# Patient Record
Sex: Female | Born: 1985 | Race: Black or African American | Hispanic: No | Marital: Single | Smoking: Current every day smoker
Health system: Southern US, Community
[De-identification: ages and names within clinical notes are randomized; demographics above are authoritative.]

## PROBLEM LIST (undated history)

## (undated) DIAGNOSIS — R51 Headache: Secondary | ICD-10-CM

## (undated) DIAGNOSIS — R519 Headache, unspecified: Secondary | ICD-10-CM

## (undated) DIAGNOSIS — F419 Anxiety disorder, unspecified: Secondary | ICD-10-CM

## (undated) HISTORY — PX: EXTERNAL EAR SURGERY: SHX627

## (undated) HISTORY — PX: FRACTURE SURGERY: SHX138

## (undated) HISTORY — DX: Headache: R51

## (undated) HISTORY — DX: Anxiety disorder, unspecified: F41.9

## (undated) HISTORY — PX: OTHER SURGICAL HISTORY: SHX169

## (undated) HISTORY — DX: Headache, unspecified: R51.9

---
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
## 2013-07-28 ENCOUNTER — Emergency Department (HOSPITAL_COMMUNITY): Payer: BC Managed Care – PPO

## 2013-07-28 ENCOUNTER — Encounter (HOSPITAL_COMMUNITY): Payer: Self-pay | Admitting: Emergency Medicine

## 2013-07-28 ENCOUNTER — Emergency Department (HOSPITAL_COMMUNITY)
Admission: EM | Admit: 2013-07-28 | Discharge: 2013-07-29 | Disposition: A | Discharge status: Home / Self Care | Payer: BC Managed Care – PPO | Attending: Emergency Medicine | Admitting: Emergency Medicine

## 2013-07-28 DIAGNOSIS — Y9367 Activity, basketball: Secondary | ICD-10-CM | POA: Insufficient documentation

## 2013-07-28 DIAGNOSIS — S93409A Sprain of unspecified ligament of unspecified ankle, initial encounter: Secondary | ICD-10-CM | POA: Insufficient documentation

## 2013-07-28 DIAGNOSIS — S93402A Sprain of unspecified ligament of left ankle, initial encounter: Secondary | ICD-10-CM

## 2013-07-28 DIAGNOSIS — Y9239 Other specified sports and athletic area as the place of occurrence of the external cause: Secondary | ICD-10-CM | POA: Insufficient documentation

## 2013-07-28 DIAGNOSIS — F17200 Nicotine dependence, unspecified, uncomplicated: Secondary | ICD-10-CM | POA: Insufficient documentation

## 2013-07-28 DIAGNOSIS — X500XXA Overexertion from strenuous movement or load, initial encounter: Secondary | ICD-10-CM | POA: Insufficient documentation

## 2013-07-28 NOTE — ED Provider Notes (Signed)
CSN: [id number]     Arrival date & time 07/28/13  2118 History  This chart was scribed for non-physician practitioner Marlon Pel, PA-C, working with Layla Maw Ward, DO by Dorothey Baseman, ED Scribe. This patient was seen in room TR06C/TR06C and the patient's care was started at 11:42 PM.    Chief Complaint  Patient presents with  . Ankle Pain   The history is provided by the patient. No language interpreter was used.   HPI Comments: Teresa Rose is a 27 y.o. female who presents to the Emergency Department complaining of an injury to the left ankle onset around 6 hours ago when she states that she was playing basketball and rolled her ankle. Patient reports an associated constant pain. She denies hitting her head or syncope. Patient states that she has been ambulatory since onset, but that the pain is exacerbated with walking and bearing weight. She reports associated swelling to the area. She reports taking Tylenol at home with mild, temporary relief. Patient states that she fractured her left toe 6 weeks ago. She denies any other pertinent medical history.   History reviewed. No pertinent past medical history. History reviewed. No pertinent past surgical history. No family history on file. History  Substance Use Topics  . Smoking status: Current Every Day Smoker  . Smokeless tobacco: Not on file  . Alcohol Use: No   OB History   Grav Para Term Preterm Abortions TAB SAB Ect Mult Living                 Review of Systems  Musculoskeletal: Positive for arthralgias and joint swelling.  Neurological: Negative for syncope.  All other systems reviewed and are negative.    Allergies  Review of patient's allergies indicates no known allergies.  Home Medications   Current Outpatient Rx  Name  Route  Sig  Dispense  Refill  . acetaminophen (TYLENOL) 500 MG tablet   Oral   Take 1,000 mg by mouth every 6 (six) hours as needed for pain.         Marland Kitchen HYDROcodone-acetaminophen  (NORCO/VICODIN) 5-325 MG per tablet   Oral   Take 1 tablet by mouth every 4 (four) hours as needed for pain.   10 tablet   0   . ibuprofen (ADVIL,MOTRIN) 600 MG tablet   Oral   Take 1 tablet (600 mg total) by mouth every 6 (six) hours as needed for pain.   30 tablet   0    Triage Vitals: BP 157/83  Pulse 93  Temp(Src) 98.3 F (36.8 C) (Oral)  Resp 16  SpO2 100%  LMP 07/18/2013  Physical Exam  Nursing note and vitals reviewed. Constitutional: She is oriented to person, place, and time. She appears well-developed and well-nourished. No distress.  HENT:  Head: Normocephalic and atraumatic.  Eyes: Conjunctivae are normal.  Neck: Normal range of motion. Neck supple.  Musculoskeletal: Normal range of motion.  Significant swelling and tenderness to palpation to the left lateral malleolus.   Neurological: She is alert and oriented to person, place, and time.  Skin: Skin is warm and dry.  Psychiatric: She has a normal mood and affect. Her behavior is normal.    ED Course  Procedures (including critical care time)  DIAGNOSTIC STUDIES: Oxygen Saturation is 100% on room air, normal by my interpretation.    COORDINATION OF CARE: 11:43 PM- Discussed that x-ray does not indicate any fractures. Will discharge patient with an ACE wrap, crutches, Motrin, and Vicodin. Advised patient  to apply ice to the area and to keep it elevated.Advised patient to follow up with the referred orthopaedist, especially if symptoms do not improve in about 1 week or if there are any new or worsening symptoms. Discussed treatment plan with patient at bedside and patient verbalized agreement.      Labs Review Labs Reviewed - No data to display  Imaging Review Dg Ankle Complete Left  07/28/2013   CLINICAL DATA:  Pain post trauma  EXAM: LEFT ANKLE COMPLETE - 3+ VIEW  COMPARISON:  None.  FINDINGS: Frontal, oblique, and lateral views were obtained. There is soft tissue swelling laterally. No fracture or  effusion. Ankle mortise appears intact.  IMPRESSION: Swelling laterally. No fracture. Mortise intact.   Electronically Signed   By: Bretta Bang M.D.   On: 07/28/2013 21:45    EKG Interpretation   None       MDM   1. Ankle sprain, left, initial encounter     27 y.o.Teresa Rose's evaluation in the Emergency Department is complete. It has been determined that no acute conditions requiring further emergency intervention are present at this time. The patient/guardian have been advised of the diagnosis and plan. We have discussed signs and symptoms that warrant return to the ED, such as changes or worsening in symptoms.  Vital signs are stable at discharge. Filed Vitals:   07/28/13 2123  BP: 157/83  Pulse: 93  Temp: 98.3 F (36.8 C)  Resp: 16    Patient/guardian has voiced understanding and agreed to follow-up with the PCP or specialist.  I personally performed the services described in this documentation, which was scribed in my presence. The recorded information has been reviewed and is accurate.     Dorthula Matas, PA-C 07/29/13 [phone]

## 2013-07-28 NOTE — ED Notes (Signed)
Pt. reports injury / pain / swelling at left ankle this afternoon while playing basketball .

## 2013-07-29 MED ORDER — IBUPROFEN 600 MG PO TABS: 600.0000 mg | ORAL_TABLET | Freq: Four times a day (QID) | ORAL | Status: DC | PRN

## 2013-07-29 MED ORDER — HYDROCODONE-ACETAMINOPHEN 5-325 MG PO TABS: 1.0000 | ORAL_TABLET | ORAL | Status: DC | PRN

## 2013-07-29 NOTE — Progress Notes (Signed)
Orthopedic Tech Progress Note Patient Details:  Teresa Rose 11-13-1985 [id number]  Ortho Devices Type of Ortho Device: Crutches   Haskell Flirt 07/29/2013, 12:20 AM

## 2013-07-29 NOTE — ED Provider Notes (Signed)
Medical screening examination/treatment/procedure(s) were performed by non-physician practitioner and as supervising physician I was immediately available for consultation/collaboration.   Kiril Hippe N Taisha Pennebaker, DO 07/29/13 0420 

## 2013-12-31 ENCOUNTER — Encounter: Payer: Self-pay | Admitting: Medical

## 2013-12-31 ENCOUNTER — Ambulatory Visit (INDEPENDENT_AMBULATORY_CARE_PROVIDER_SITE_OTHER): Payer: BC Managed Care – PPO | Admitting: Medical

## 2013-12-31 VITALS — BP 140/98 | HR 82 | Temp 98.2°F | Resp 16 | Ht 67.0 in | Wt 188.0 lb

## 2013-12-31 DIAGNOSIS — R519 Headache, unspecified: Secondary | ICD-10-CM

## 2013-12-31 DIAGNOSIS — R030 Elevated blood-pressure reading, without diagnosis of hypertension: Secondary | ICD-10-CM

## 2013-12-31 DIAGNOSIS — R51 Headache: Secondary | ICD-10-CM

## 2013-12-31 DIAGNOSIS — Z8249 Family history of ischemic heart disease and other diseases of the circulatory system: Secondary | ICD-10-CM

## 2013-12-31 NOTE — Patient Instructions (Signed)
Thank you for giving me the opportunity to serve you today.    Your diagnosis today includes: Encounter Diagnosis  Name Primary?  . Elevated blood pressure reading without diagnosis of hypertension Yes     Specific recommendations today include:  Begin lifestyle changes to help improve your blood pressure and headaches  Gradually start cutting back on caffeine and salt including Mountain Dew  Limit salt in general (canned foods, fast food, fried foods, eating out)  Begin drinking more water  Begin more of a DASH diet   Check your blood pressure 1-2 times per week, write these numbers down with date and time  Work on losing a little weight through exercise and these diet changes above  Recheck 3 months   I have included other useful information below for your review.  Hypertension As your heart beats, it forces blood through your arteries. This force is your blood pressure. If the pressure is too high, it is called hypertension (HTN) or high blood pressure. HTN is dangerous because you may have it and not know it. High blood pressure may mean that your heart has to work harder to pump blood. Your arteries may be narrow or stiff. The extra work puts you at risk for heart disease, stroke, and other problems.  Blood pressure consists of two numbers, a higher number over a lower, 110/72, for example. It is stated as "110 over 72." The ideal is below 120 for the top number (systolic) and under 80 for the bottom (diastolic). Write down your blood pressure today. You should pay close attention to your blood pressure if you have certain conditions such as:  Heart failure.  Prior heart attack.  Diabetes  Chronic kidney disease.  Prior stroke.  Multiple risk factors for heart disease. To see if you have HTN, your blood pressure should be measured while you are seated with your arm held at the level of the heart. It should be measured at least twice. A one-time elevated blood  pressure reading (especially in the Emergency Department) does not mean that you need treatment. There may be conditions in which the blood pressure is different between your right and left arms. It is important to see your caregiver soon for a recheck. Most people have essential hypertension which means that there is not a specific cause. This type of high blood pressure may be lowered by changing lifestyle factors such as:  Stress.  Smoking.  Lack of exercise.  Excessive weight.  Drug/tobacco/alcohol use.  Eating less salt. Most people do not have symptoms from high blood pressure until it has caused damage to the body. Effective treatment can often prevent, delay or reduce that damage. TREATMENT  When a cause has been identified, treatment for high blood pressure is directed at the cause. There are a large number of medications to treat HTN. These fall into several categories, and your caregiver will help you select the medicines that are [place] for you. Medications may have side effects. You should review side effects with your caregiver. If your blood pressure stays high after you have made lifestyle changes or started on medicines,   Your medication(s) may need to be changed.  Other problems may need to be addressed.  Be certain you understand your prescriptions, and know how and when to take your medicine.  Be sure to follow up with your caregiver within the time frame advised (usually within two weeks) to have your blood pressure rechecked and to review your medications.  If  you are taking more than one medicine to lower your blood pressure, make sure you know how and at what times they should be taken. Taking two medicines at the same time can result in blood pressure that is too low. SEEK IMMEDIATE MEDICAL CARE IF:  You develop a severe headache, blurred or changing vision, or confusion.  You have unusual weakness or numbness, or a faint feeling.  You have severe chest or  abdominal pain, vomiting, or breathing problems. MAKE SURE YOU:   Understand these instructions.  Will watch your condition.  Will get help right away if you are not doing well or get worse. Document Released: 09/30/2005 Document Revised: 12/23/2011 Document Reviewed: 05/20/2008 [facility] Patient Information 2014 [place], [place].   DASH Diet The DASH diet stands for "Dietary Approaches to Stop Hypertension." It is a healthy eating plan that has been shown to reduce high blood pressure (hypertension) in as little as 14 days, while also possibly providing other significant health benefits. These other health benefits include reducing the risk of breast cancer after menopause and reducing the risk of type 2 diabetes, heart disease, colon cancer, and stroke. Health benefits also include weight loss and slowing kidney failure in patients with chronic kidney disease.  DIET GUIDELINES  Limit salt (sodium). Your diet should contain less than 1500 mg of sodium daily.  Limit refined or processed carbohydrates. Your diet should include mostly whole grains. Desserts and added sugars should be used sparingly.  Include small amounts of heart-healthy fats. These types of fats include nuts, oils, and tub margarine. Limit saturated and trans fats. These fats have been shown to be harmful in the body. CHOOSING FOODS  The following food groups are based on a 2000 calorie diet. See your Registered Dietitian for individual calorie needs. Grains and Grain Products (6 to 8 servings daily)  Eat More Often: Whole-wheat bread, brown rice, whole-grain or wheat pasta, quinoa, popcorn without added fat or salt (air popped).  Eat Less Often: White bread, white pasta, white rice, cornbread. Vegetables (4 to 5 servings daily)  Eat More Often: Fresh, frozen, and canned vegetables. Vegetables may be raw, steamed, roasted, or grilled with a minimal amount of fat.  Eat Less Often/Avoid: Creamed or fried vegetables.  Vegetables in a cheese sauce. Fruit (4 to 5 servings daily)  Eat More Often: All fresh, canned (in natural juice), or frozen fruits. Dried fruits without added sugar. One hundred percent fruit juice ( cup [237 mL] daily).  Eat Less Often: Dried fruits with added sugar. Canned fruit in light or heavy syrup. [employer], Fish, and Poultry (2 servings or less daily. One serving is 3 to 4 oz [85-114 g]).  Eat More Often: Ninety percent or leaner ground beef, tenderloin, sirloin. Round cuts of beef, chicken breast, [place] breast. All fish. Grill, bake, or broil your meat. Nothing should be fried.  Eat Less Often/Avoid: Fatty cuts of meat, [place], or chicken leg, thigh, or wing. Fried cuts of meat or fish. Dairy (2 to 3 servings)  Eat More Often: Low-fat or fat-free milk, low-fat plain or light yogurt, reduced-fat or part-skim cheese.  Eat Less Often/Avoid: Milk (whole, 2%).Whole milk yogurt. Full-fat cheeses. Nuts, Seeds, and Legumes (4 to 5 servings per week)  Eat More Often: All without added salt.  Eat Less Often/Avoid: Salted nuts and seeds, canned beans with added salt. Fats and Sweets (limited)  Eat More Often: Vegetable oils, tub margarines without trans fats, sugar-free gelatin. Mayonnaise and salad dressings.  Eat Less  Often/Avoid: Coconut oils, palm oils, butter, stick margarine, cream, half and half, cookies, candy, pie. FOR MORE INFORMATION The Dash Diet Eating Plan: www.dashdiet.org Document Released: 09/19/2011 Document Revised: 12/23/2011 Document Reviewed: 09/19/2011 [facility] Patient Information 2014 [place], [place].

## 2013-12-31 NOTE — Progress Notes (Signed)
   Subjective:   Teresa Rose is a 28 y.o. female presenting on 12/31/2013 with elevated BP  Here to establish care.   No routine medical care in a while.    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  Wednesday had career fair at school, and one of the medical studies student checked her BP.  Teacher came back next day and recheck her BP.  Was elevated both days.  Was advised to see a doctor about elevated BP.  Denies chest pain, swelling in legs, vision changes, no urinary changes.   Feeling healthy in general.  She uses no diet discretion.   Does some times add salt to food.  Drinks soda every day.   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 No other aggravating or relieving factors.  No other complaint.  Review of Systems Constitutional: -fever, -chills, -sweats, -unexpected weight change,-fatigue Cardiology:  -chest pain, -palpitations, -edema Respiratory: -cough, -shortness of breath, -wheezing Gastroenterology: -abdominal pain, -nausea, -vomiting, -diarrhea, -constipation Hematology: -bleeding or bruising problems Ophthalmology: -vision changes Urology: -dysuria, -difficulty urinating, -hematuria, -urinary frequency, -urgency Neurology: -headache, -weakness, -tingling, -numbness       Objective:     Filed Vitals:   12/31/13 1042  BP: 140/98  Pulse: 82  Temp: 98.2 F (36.8 C)  Resp: 16    General appearance: alert, no distress, WD/WN, pleasant AA female Neck: supple, no lymphadenopathy, no thyromegaly, no masses, no bruits Heart: RRR, normal S1, S2, no murmurs Lungs: CTA bilaterally, no wheezes, rhonchi, or rales Abdomen: +bs, soft, non tender, non distended, no masses, no hepatomegaly, no splenomegaly, no bruits Pulses: 2+ symmetric, upper and lower extremities, normal cap refill Extremities no edema      Assessment: Encounter Diagnoses  Name Primary?  . Elevated blood pressure reading without diagnosis of hypertension  Yes  . Frequent headaches   . Family history of premature CAD      Plan: We discussed her blood pressure findings, her diet, her headaches, her family history. We discussed causes of blood pressure problems, lifestyle changes that can be made, medications, and at this time she is willing to work on modifying her diet, reducing her caffeine and salt, increasing her water and exercise, and we will plan to recheck in 3 months for a physical.  Teresa Rose was seen today for elevated bp.  Diagnoses and associated orders for this visit:  Elevated blood pressure reading without diagnosis of hypertension  Frequent headaches  Family history of premature CAD    Return in about 3 months (around 04/02/2014).

## 2014-04-01 ENCOUNTER — Ambulatory Visit: Payer: BC Managed Care – PPO | Admitting: Medical

## 2014-04-01 ENCOUNTER — Encounter: Payer: Self-pay | Admitting: Medical

## 2014-04-19 ENCOUNTER — Telehealth: Payer: Self-pay | Admitting: Medical

## 2014-04-19 NOTE — Telephone Encounter (Signed)
The no show letter that was sent to pt. Was returned undeliverable.

## 2017-02-08 ENCOUNTER — Emergency Department (HOSPITAL_COMMUNITY)
Admission: EM | Admit: 2017-02-08 | Discharge: 2017-02-08 | Disposition: A | Discharge status: Home / Self Care | Payer: No Typology Code available for payment source | Attending: Emergency Medicine | Admitting: Emergency Medicine

## 2017-02-08 ENCOUNTER — Encounter (HOSPITAL_COMMUNITY): Payer: Self-pay | Admitting: Emergency Medicine

## 2017-02-08 ENCOUNTER — Emergency Department (HOSPITAL_COMMUNITY): Payer: No Typology Code available for payment source

## 2017-02-08 DIAGNOSIS — Y92410 Unspecified street and highway as the place of occurrence of the external cause: Secondary | ICD-10-CM | POA: Insufficient documentation

## 2017-02-08 DIAGNOSIS — Y9389 Activity, other specified: Secondary | ICD-10-CM | POA: Insufficient documentation

## 2017-02-08 DIAGNOSIS — S80811A Abrasion, right lower leg, initial encounter: Secondary | ICD-10-CM | POA: Insufficient documentation

## 2017-02-08 DIAGNOSIS — S80812A Abrasion, left lower leg, initial encounter: Secondary | ICD-10-CM | POA: Insufficient documentation

## 2017-02-08 DIAGNOSIS — F17200 Nicotine dependence, unspecified, uncomplicated: Secondary | ICD-10-CM | POA: Insufficient documentation

## 2017-02-08 DIAGNOSIS — Y999 Unspecified external cause status: Secondary | ICD-10-CM | POA: Insufficient documentation

## 2017-02-08 DIAGNOSIS — S40812A Abrasion of left upper arm, initial encounter: Secondary | ICD-10-CM | POA: Insufficient documentation

## 2017-02-08 MED ORDER — IBUPROFEN 200 MG PO TABS
600.0000 mg | ORAL_TABLET | Freq: Once | ORAL | Status: AC
  Administered 2017-02-08: 600 mg via ORAL
  Filled 2017-02-08: qty 1

## 2017-02-08 MED ORDER — METHOCARBAMOL 500 MG PO TABS: 500.0000 mg | ORAL_TABLET | Freq: Two times a day (BID) | ORAL | 0 refills | Status: DC

## 2017-02-08 MED ORDER — NAPROXEN 500 MG PO TABS: 500.0000 mg | ORAL_TABLET | Freq: Two times a day (BID) | ORAL | 0 refills | Status: DC

## 2017-02-08 NOTE — Discharge Instructions (Signed)
Take your medications as prescribed. I also recommend applying ice and/or heat to affected area for 15-20 minutes 3-4 times daily for additional pain relief. Refrain from doing any heavy lifting, squatting or repetitive movements that exacerbate your symptoms. Follow-up with your primary care provider in the next week if her symptoms have not improved.  Please return to the Emergency Department if symptoms worsen or new onset of fever, numbness, tingling, groin anesthesia, loss of bowel or bladder, weakness, unable to walk.
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
## 2017-02-08 NOTE — ED Notes (Signed)
Patient transported to X-ray 
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
## 2019-10-05 ENCOUNTER — Ambulatory Visit: Payer: BC Managed Care – PPO | Admitting: Family Medicine

## 2019-10-20 ENCOUNTER — Ambulatory Visit: Payer: BC Managed Care – PPO | Admitting: Family Medicine

## 2019-10-21 ENCOUNTER — Encounter: Payer: BC Managed Care – PPO | Admitting: Family Medicine

## 2020-10-18 LAB — OB RESULTS CONSOLE HEPATITIS B SURFACE ANTIGEN: Hepatitis B Surface Ag: NEGATIVE

## 2020-10-18 LAB — OB RESULTS CONSOLE HIV ANTIBODY (ROUTINE TESTING): HIV: NONREACTIVE

## 2020-10-18 LAB — OB RESULTS CONSOLE RUBELLA ANTIBODY, IGM: Rubella: IMMUNE

## 2020-10-19 LAB — OB RESULTS CONSOLE GC/CHLAMYDIA
Chlamydia: NEGATIVE
Gonorrhea: NEGATIVE

## 2020-10-21 ENCOUNTER — Other Ambulatory Visit: Payer: Self-pay

## 2020-11-14 DIAGNOSIS — N904 Leukoplakia of vulva: Secondary | ICD-10-CM

## 2020-11-14 HISTORY — DX: Leukoplakia of vulva: N90.4

## 2020-11-24 ENCOUNTER — Other Ambulatory Visit: Payer: Self-pay

## 2020-12-07 ENCOUNTER — Other Ambulatory Visit: Payer: Self-pay

## 2020-12-08 ENCOUNTER — Other Ambulatory Visit: Payer: Self-pay

## 2020-12-19 ENCOUNTER — Other Ambulatory Visit: Payer: Self-pay

## 2020-12-19 ENCOUNTER — Ambulatory Visit: Payer: BC Managed Care – PPO | Attending: Obstetrics and Gynecology | Admitting: Genetic Counselor

## 2020-12-19 ENCOUNTER — Ambulatory Visit: Payer: Self-pay

## 2020-12-19 ENCOUNTER — Encounter: Payer: Self-pay | Admitting: Genetic Counselor

## 2020-12-19 DIAGNOSIS — D563 Thalassemia minor: Secondary | ICD-10-CM

## 2020-12-19 DIAGNOSIS — Z315 Encounter for genetic counseling: Secondary | ICD-10-CM

## 2020-12-19 NOTE — Progress Notes (Signed)
12/19/2020  Teresa Rose 02/01/1986 MRN: [id number] DOV: 12/19/2020  Teresa Rose presented to the [facility] for a genetics consultation regarding her carrier status for alpha-thalassemia. Teresa Rose presented to her appointment alone.   Indication for genetic counseling - Silent carrier for alpha-thalassemia  Prenatal history  Teresa Rose is a G46P0, 35 y.o. female. Her current pregnancy has completed [redacted]w[redacted]d(Estimated Date of Delivery: 05/21/21).  Ms. BOsmerdenied exposure to environmental toxins or chemical agents. She denied the use of alcohol, tobacco or street drugs. She reported taking prenatal vitamins and vitamin D. She denied significant viral illnesses, fevers, and bleeding during the course of her pregnancy. Her medical and surgical histories were noncontributory.  Family History  A three generation pedigree was drafted and reviewed. Both family histories were reviewed and found to be noncontributory for birth defects, intellectual disability, recurrent pregnancy loss, and known genetic conditions. Teresa Rose limited information about her Rose's paternal family history; thus, risk assessment was limited.   The patient's ancestry is African American. She also has distant Caucasian ancestry. The father of the pregnancy's ancestry is African American. Consanguinity was denied. Teresa Rose uncertain if she has any Ashkenazi Jewish ancestry. Pedigree will be scanned under Media.  Discussion  Alpha-thalassemia:  Teresa Rose Horizon-14 carrier screening performed through [facility] The results of the screen identified her as a silent carrier for alpha-thalassemia (aa/a-). Alpha-thalassemia is different in its inheritance compared to other hemoglobinopathies as there are two copies of two alpha globin genes (HBA1 and HBA2) on each chromosome 16, or four alpha globin genes total (aa/aa). A person can be a carrier of one alpha gene mutation (aa/a-), also referred to as a  "silent carrier". A person who carries two alpha globin gene mutations can either carry them in cis (both on the same chromosome, denoted as aa/--) or in trans (on different chromosomes, denoted as a-/a-). Alpha-thalassemia carriers of two mutations who have African American ancestry are more likely to have a trans arrangement (a-/a-); cis configuration is reported to be rare in individuals with African American ancestry.     There are several different forms of alpha-thalassemia. The most severe form of alpha-thalassemia, Hb Barts, is associated with an absence of alpha globin chain synthesis as a result of deletions of all four alpha globin genes (--/--).  Given that Teresa Rose is a silent carrier (aa/a-), her pregnancies would not be at increased risk for Hb Barts, even if her Rose is a carrier for alpha-thalassemia, as she will always pass on at least one copy of the alpha globin gene to her children. Hemoglobin H (HbH) disease is caused by three deleted or dysfunctioning alpha globin alleles (a-/--) and is characterized by microcytic hypochromic hemolytic anemia, hepatosplenomegaly, mild jaundice, growth retardation, and sometimes thalassemia-like bone changes. Given Teresa Rose's silent carrier status (aa/a-), the current fetus would only be at risk for HbH disease (a-/--), if her Rose is a carrier for two alpha globin mutations in cis (aa/--). If this is the case, the risk for HbH disease in the pregnancy would be 1 in 4 (25%). However, if Teresa Rose's Rose is a carrier for two alpha globin mutations, he would be more likely to carry them in trans configuration (a-/a-) than the cis configuration (aa/--), given his ethnicity. If he is a carrier of alpha-thalassemia in trans, then the pregnancy would not be at increased risk for HbH disease. Based on the carrier frequency for alpha-thalassemia in the African  American population, Teresa Rose's Rose has a 1 in 30 chance of being any type of carrier for  alpha-thalassemia. Thus, the couple has a <1% chance of having a child with HbH disease.  Teresa Rose carrier screening was negative for the other 13 conditions screened. Thus, her risk to be a carrier for these additional conditions (listed separately in the laboratory report) has been reduced but not eliminated. This also significantly reduces her risk of having a child affected by one of these conditions. We discussed that carrier testing for alpha-thalassemia is recommended for Teresa Rose. Teresa Rose indicated that her Rose may be interested in pursuing carrier screening.  Aneuploidy screening results:  We also reviewed that Teresa Rose had Panorama NIPS through the laboratory [name] that was low-risk for fetal aneuploidies. We reviewed that these results showed a less than 1 in 10,000 risk for trisomies 21, 18 and 13, and monosomy X (Turner syndrome).  In addition, the risk for triploidy and sex chromosome trisomies (47,XXX and 47,XXY) was also low. Teresa Rose elected to have cfDNA analysis for 22q11.2 deletion syndrome, which was also low risk (1 in 3100). We reviewed that while this testing identifies 94-99% of pregnancies with trisomy 28, trisomy 42, trisomy 29, sex chromosome aneuploidies, and triploidy, it is NOT diagnostic. A positive test result requires confirmation by [facility] or amniocentesis, and a negative test result does not rule out a fetal chromosome abnormality. She also understands that this testing does not identify all genetic conditions.  Diagnostic testing:  Teresa Rose was also counseled regarding diagnostic testing via amniocentesis. We discussed the technical aspects of the procedure and quoted up to a 1 in 500 (0.2%) risk for spontaneous pregnancy loss or other adverse pregnancy outcomes as a result of amniocentesis. Cultured cells from an amniocentesis sample allow for the visualization of a fetal karyotype, which can detect >99% of large chromosomal aberrations. Chromosomal  microarray can also be performed to identify smaller deletions or duplications of fetal chromosomal material. Amniocentesis could also be performed to assess whether the baby is affected by alpha-thalassemia. After careful consideration, Ms. Bicknell declined amniocentesis at this time. She understands that amniocentesis is available at any point after 16 weeks of pregnancy and that she may opt to undergo the procedure at a later date should she change her mind.  Plan:  Ms. Caffey was potentially interested in alpha-thalassemia carrier screening for her Rose, Rondrigus Rosana Hoes. However, he recently got a new job so his health insurance is not yet activated. We reviewed that Mr. Rosana Hoes qualifies for free testing through [employer]'s Patient Assistance Program. I provided Ms. Dall with an application form for the Patient Assistance Program and encouraged her to notify me if her Rose is interested in pursuing screening. I will have the laboratory mail him a saliva kit from there.  I counseled Ms. Macpherson regarding the above risks and available options. The approximate face-to-face time with the genetic counselor was 40 minutes.  In summary:  Discussed carrier screening results and options for follow-up testing  Silent carrier for alpha-thalassemia  Recommend Rose carrier screening. Rose qualifies for free testing. Patient will discuss this with her Rose and notify me if interested. I will facilitate testing from there  Reviewed low-risk NIPS result  Reduction in risk for Down syndrome, trisomy 48, trisomy 35, triploidy, sex chromosome aneuploidies, and 22q11.2 deletion syndrome  Offered additional testing and screening  Declined amniocentesis  Reviewed family history concerns   Buelah Manis, MS, Counselling psychologist

## 2021-04-13 DIAGNOSIS — A6000 Herpesviral infection of urogenital system, unspecified: Secondary | ICD-10-CM | POA: Insufficient documentation

## 2021-04-13 HISTORY — DX: Herpesviral infection of urogenital system, unspecified: A60.00

## 2021-05-10 ENCOUNTER — Other Ambulatory Visit: Payer: Self-pay | Admitting: Obstetrics and Gynecology

## 2021-05-11 ENCOUNTER — Encounter (HOSPITAL_COMMUNITY): Payer: Self-pay

## 2021-05-11 NOTE — Patient Instructions (Signed)
Teresa Rose  05/11/2021   Your procedure is scheduled on:  05/16/2021  Arrive at 0930 at Entrance C on [address] at [facility]  and [address]. You are invited to use the FREE valet parking or use the Visitor's parking deck.  Pick up the phone at the desk and dial [phone].  Call this number if you have problems the morning of surgery: [phone]  Remember:   Do not eat food:(After Midnight) Desps de medianoche.  Do not drink clear liquids: (After Midnight) Desps de medianoche.  Take these medicines the morning of surgery with A SIP OF WATER:  Take valtrex and nitrofurantoin as prescribed   Do not wear jewelry, make-up or nail polish.  Do not wear lotions, powders, or perfumes. Do not wear deodorant.  Do not shave 48 hours prior to surgery.  Do not bring valuables to the hospital.  [facility] is not   responsible for any belongings or valuables brought to the hospital.  Contacts, dentures or bridgework may not be worn into surgery.  Leave suitcase in the car. After surgery it may be brought to your room.  For patients admitted to the hospital, checkout time is 11:00 AM the day of              discharge.      Please read over the following fact sheets that you were given:     Preparing for Surgery

## 2021-05-14 ENCOUNTER — Encounter (HOSPITAL_COMMUNITY)
Admission: RE | Admit: 2021-05-14 | Discharge: 2021-05-14 | Disposition: A | Discharge status: Home / Self Care | Payer: BC Managed Care – PPO | Source: Ambulatory Visit | Attending: Obstetrics and Gynecology | Admitting: Obstetrics and Gynecology

## 2021-05-14 ENCOUNTER — Other Ambulatory Visit: Payer: Self-pay

## 2021-05-14 ENCOUNTER — Other Ambulatory Visit: Payer: Self-pay | Admitting: Obstetrics and Gynecology

## 2021-05-14 DIAGNOSIS — Z01812 Encounter for preprocedural laboratory examination: Secondary | ICD-10-CM | POA: Insufficient documentation

## 2021-05-14 LAB — BASIC METABOLIC PANEL
Anion gap: 9 (ref 5–15)
BUN: 6 mg/dL (ref 6–20)
CO2: 20 mmol/L — ABNORMAL LOW (ref 22–32)
Calcium: 9.3 mg/dL (ref 8.9–10.3)
Chloride: 106 mmol/L (ref 98–111)
Creatinine, Ser: 0.66 mg/dL (ref 0.44–1.00)
GFR, Estimated: >60 mL/min (ref >60)
Glucose, Bld: 93 mg/dL (ref 70–99)
Potassium: 3.4 mmol/L — ABNORMAL LOW (ref 3.5–5.1)
Sodium: 135 mmol/L (ref 135–145)

## 2021-05-14 LAB — CBC
HCT: 34.8 % — ABNORMAL LOW (ref 36.0–46.0)
Hemoglobin: 11 g/dL — ABNORMAL LOW (ref 12.0–15.0)
MCH: 25.7 pg — ABNORMAL LOW (ref 26.0–34.0)
MCHC: 31.6 g/dL (ref 30.0–36.0)
MCV: 81.3 fL (ref 80.0–100.0)
Platelets: 311 10*3/uL (ref 150–400)
RBC: 4.28 MIL/uL (ref 3.87–5.11)
RDW: 15.6 % — ABNORMAL HIGH (ref 11.5–15.5)
WBC: 7.8 10*3/uL (ref 4.0–10.5)
nRBC: 0 % (ref 0.0–0.2)

## 2021-05-14 LAB — TYPE AND SCREEN
ABO/RH(D): B NEG
Antibody Screen: NEGATIVE

## 2021-05-15 LAB — RPR: RPR Ser Ql: NONREACTIVE

## 2021-05-15 LAB — SARS CORONAVIRUS 2 (TAT 6-24 HRS): SARS Coronavirus 2: POSITIVE — AB

## 2021-05-15 NOTE — Pre-Procedure Instructions (Signed)
Dr Su Hilt, [facility] OR Charge nurse and patient informed of positive covid result.  Plan of care discussed with patient and all questions answered.

## 2021-05-16 ENCOUNTER — Encounter (HOSPITAL_COMMUNITY)
Admission: RE | Disposition: A | Discharge status: Home / Self Care | Payer: Self-pay | Source: Home / Self Care | Attending: Obstetrics & Gynecology

## 2021-05-16 ENCOUNTER — Inpatient Hospital Stay (HOSPITAL_COMMUNITY)
Admission: RE | Admit: 2021-05-16 | Discharge: 2021-05-18 | DRG: 787 | Disposition: A | Discharge status: Home / Self Care | Payer: BC Managed Care – PPO | Attending: Obstetrics & Gynecology | Admitting: Obstetrics & Gynecology

## 2021-05-16 ENCOUNTER — Other Ambulatory Visit: Payer: Self-pay

## 2021-05-16 ENCOUNTER — Encounter (HOSPITAL_COMMUNITY): Payer: Self-pay | Admitting: Obstetrics and Gynecology

## 2021-05-16 ENCOUNTER — Inpatient Hospital Stay (HOSPITAL_COMMUNITY): Payer: BC Managed Care – PPO | Admitting: Anesthesiology

## 2021-05-16 DIAGNOSIS — O9081 Anemia of the puerperium: Secondary | ICD-10-CM | POA: Diagnosis not present

## 2021-05-16 DIAGNOSIS — Z3A39 39 weeks gestation of pregnancy: Secondary | ICD-10-CM

## 2021-05-16 DIAGNOSIS — D62 Acute posthemorrhagic anemia: Secondary | ICD-10-CM | POA: Diagnosis not present

## 2021-05-16 DIAGNOSIS — O99334 Smoking (tobacco) complicating childbirth: Secondary | ICD-10-CM | POA: Diagnosis present

## 2021-05-16 DIAGNOSIS — B009 Herpesviral infection, unspecified: Secondary | ICD-10-CM | POA: Diagnosis present

## 2021-05-16 DIAGNOSIS — O9832 Other infections with a predominantly sexual mode of transmission complicating childbirth: Principal | ICD-10-CM | POA: Diagnosis present

## 2021-05-16 DIAGNOSIS — D563 Thalassemia minor: Secondary | ICD-10-CM

## 2021-05-16 DIAGNOSIS — A6000 Herpesviral infection of urogenital system, unspecified: Secondary | ICD-10-CM | POA: Diagnosis present

## 2021-05-16 DIAGNOSIS — F17210 Nicotine dependence, cigarettes, uncomplicated: Secondary | ICD-10-CM | POA: Diagnosis present

## 2021-05-16 DIAGNOSIS — Z6791 Unspecified blood type, Rh negative: Secondary | ICD-10-CM | POA: Diagnosis present

## 2021-05-16 DIAGNOSIS — O98513 Other viral diseases complicating pregnancy, third trimester: Secondary | ICD-10-CM | POA: Diagnosis present

## 2021-05-16 DIAGNOSIS — Z98891 History of uterine scar from previous surgery: Secondary | ICD-10-CM

## 2021-05-16 HISTORY — DX: Thalassemia minor: D56.3

## 2021-05-16 LAB — COMPREHENSIVE METABOLIC PANEL
ALT: 11 U/L (ref 0–44)
AST: 15 U/L (ref 15–41)
Albumin: 2.6 g/dL — ABNORMAL LOW (ref 3.5–5.0)
Alkaline Phosphatase: 143 U/L — ABNORMAL HIGH (ref 38–126)
Anion gap: 8 (ref 5–15)
BUN: 6 mg/dL (ref 6–20)
CO2: 21 mmol/L — ABNORMAL LOW (ref 22–32)
Calcium: 8.9 mg/dL (ref 8.9–10.3)
Chloride: 106 mmol/L (ref 98–111)
Creatinine, Ser: 0.73 mg/dL (ref 0.44–1.00)
GFR, Estimated: >60 mL/min (ref >60)
Glucose, Bld: 83 mg/dL (ref 70–99)
Potassium: 3.7 mmol/L (ref 3.5–5.1)
Sodium: 135 mmol/L (ref 135–145)
Total Bilirubin: 0.6 mg/dL (ref 0.3–1.2)
Total Protein: 5.5 g/dL — ABNORMAL LOW (ref 6.5–8.1)

## 2021-05-16 LAB — LACTATE DEHYDROGENASE: LDH: 128 U/L (ref 98–192)

## 2021-05-16 LAB — PROTEIN / CREATININE RATIO, URINE
Creatinine, Urine: 145.69 mg/dL
Protein Creatinine Ratio: 0.23 mg/mg{Cre} — ABNORMAL HIGH (ref 0.00–0.15)
Total Protein, Urine: 33 mg/dL

## 2021-05-16 LAB — CBC
HCT: 33 % — ABNORMAL LOW (ref 36.0–46.0)
Hemoglobin: 10.3 g/dL — ABNORMAL LOW (ref 12.0–15.0)
MCH: 25.4 pg — ABNORMAL LOW (ref 26.0–34.0)
MCHC: 31.2 g/dL (ref 30.0–36.0)
MCV: 81.3 fL (ref 80.0–100.0)
Platelets: 297 10*3/uL (ref 150–400)
RBC: 4.06 MIL/uL (ref 3.87–5.11)
RDW: 15.6 % — ABNORMAL HIGH (ref 11.5–15.5)
WBC: 8.1 10*3/uL (ref 4.0–10.5)
nRBC: 0 % (ref 0.0–0.2)

## 2021-05-16 LAB — URIC ACID: Uric Acid, Serum: 4.1 mg/dL (ref 2.5–7.1)

## 2021-05-16 SURGERY — Surgical Case
Anesthesia: Spinal

## 2021-05-16 MED ORDER — LACTATED RINGERS IV SOLN: INTRAVENOUS | Status: DC | PRN

## 2021-05-16 MED ORDER — PHENYLEPHRINE HCL-NACL 20-0.9 MG/250ML-% IV SOLN
INTRAVENOUS | Status: AC
  Filled 2021-05-16: qty 250

## 2021-05-16 MED ORDER — SODIUM CHLORIDE 0.9 % IR SOLN
Status: DC | PRN
  Administered 2021-05-16: 500 mL

## 2021-05-16 MED ORDER — NALBUPHINE HCL 10 MG/ML IJ SOLN
5.0000 mg | Freq: Once | INTRAMUSCULAR | Status: DC | PRN
Start: 2021-05-16 — End: 2021-05-18

## 2021-05-16 MED ORDER — DIPHENHYDRAMINE HCL 25 MG PO CAPS: 25.0000 mg | ORAL_CAPSULE | ORAL | Status: DC | PRN

## 2021-05-16 MED ORDER — MEPERIDINE HCL 25 MG/ML IJ SOLN: 6.2500 mg | INTRAMUSCULAR | Status: DC | PRN

## 2021-05-16 MED ORDER — DIPHENHYDRAMINE HCL 50 MG/ML IJ SOLN: 12.5000 mg | INTRAMUSCULAR | Status: DC | PRN

## 2021-05-16 MED ORDER — SODIUM CHLORIDE 0.9% FLUSH: 3.0000 mL | INTRAVENOUS | Status: DC | PRN

## 2021-05-16 MED ORDER — PHENYLEPHRINE HCL (PRESSORS) 10 MG/ML IV SOLN
INTRAVENOUS | Status: DC | PRN
  Administered 2021-05-16 (×2): 120 ug via INTRAVENOUS
  Administered 2021-05-16: 80 ug via INTRAVENOUS
  Administered 2021-05-16: 160 ug via INTRAVENOUS
  Administered 2021-05-16 (×2): 80 ug via INTRAVENOUS

## 2021-05-16 MED ORDER — KETOROLAC TROMETHAMINE 30 MG/ML IJ SOLN
30.0000 mg | Freq: Four times a day (QID) | INTRAMUSCULAR | Status: AC
Start: 2021-05-16 — End: 2021-05-17
  Administered 2021-05-16 – 2021-05-17 (×4): 30 mg via INTRAVENOUS
  Filled 2021-05-16 (×4): qty 1

## 2021-05-16 MED ORDER — WITCH HAZEL-GLYCERIN EX PADS: 1.0000 "application " | MEDICATED_PAD | CUTANEOUS | Status: DC | PRN

## 2021-05-16 MED ORDER — CEFAZOLIN SODIUM-DEXTROSE 2-4 GM/100ML-% IV SOLN
2.0000 g | INTRAVENOUS | Status: AC
  Administered 2021-05-16: 2 g via INTRAVENOUS

## 2021-05-16 MED ORDER — ONDANSETRON HCL 4 MG/2ML IJ SOLN
INTRAMUSCULAR | Status: DC | PRN
  Administered 2021-05-16: 4 mg via INTRAVENOUS

## 2021-05-16 MED ORDER — HYDROMORPHONE HCL 1 MG/ML IJ SOLN: 0.2500 mg | INTRAMUSCULAR | Status: DC | PRN

## 2021-05-16 MED ORDER — NALBUPHINE HCL 10 MG/ML IJ SOLN: 5.0000 mg | INTRAMUSCULAR | Status: DC | PRN

## 2021-05-16 MED ORDER — KETOROLAC TROMETHAMINE 30 MG/ML IJ SOLN
INTRAMUSCULAR | Status: AC
  Filled 2021-05-16: qty 1

## 2021-05-16 MED ORDER — ACETAMINOPHEN 10 MG/ML IV SOLN
INTRAVENOUS | Status: AC
  Filled 2021-05-16: qty 100

## 2021-05-16 MED ORDER — ONDANSETRON HCL 4 MG/2ML IJ SOLN
4.0000 mg | Freq: Three times a day (TID) | INTRAMUSCULAR | Status: DC | PRN
  Administered 2021-05-16: 4 mg via INTRAVENOUS
  Filled 2021-05-16: qty 2

## 2021-05-16 MED ORDER — MORPHINE SULFATE (PF) 0.5 MG/ML IJ SOLN
INTRAMUSCULAR | Status: AC
  Filled 2021-05-16: qty 10

## 2021-05-16 MED ORDER — ONDANSETRON HCL 4 MG/2ML IJ SOLN
INTRAMUSCULAR | Status: AC
  Filled 2021-05-16: qty 2

## 2021-05-16 MED ORDER — LABETALOL HCL 5 MG/ML IV SOLN
20.0000 mg | INTRAVENOUS | Status: DC | PRN
  Administered 2021-05-16: 20 mg via INTRAVENOUS

## 2021-05-16 MED ORDER — SOD CITRATE-CITRIC ACID 500-334 MG/5ML PO SOLN
ORAL | Status: AC
  Filled 2021-05-16: qty 30

## 2021-05-16 MED ORDER — OXYTOCIN-SODIUM CHLORIDE 30-0.9 UT/500ML-% IV SOLN: 2.5000 [IU]/h | INTRAVENOUS | Status: AC

## 2021-05-16 MED ORDER — SCOPOLAMINE 1 MG/3DAYS TD PT72: 1.0000 | MEDICATED_PATCH | Freq: Once | TRANSDERMAL | Status: DC

## 2021-05-16 MED ORDER — TRANEXAMIC ACID-NACL 1000-0.7 MG/100ML-% IV SOLN
INTRAVENOUS | Status: AC
  Filled 2021-05-16: qty 100

## 2021-05-16 MED ORDER — DIPHENHYDRAMINE HCL 25 MG PO CAPS: 25.0000 mg | ORAL_CAPSULE | Freq: Four times a day (QID) | ORAL | Status: DC | PRN

## 2021-05-16 MED ORDER — SENNOSIDES-DOCUSATE SODIUM 8.6-50 MG PO TABS
2.0000 | ORAL_TABLET | Freq: Every day | ORAL | Status: DC
  Administered 2021-05-17 – 2021-05-18 (×2): 2 via ORAL
  Filled 2021-05-16 (×2): qty 2

## 2021-05-16 MED ORDER — LACTATED RINGERS IV SOLN: INTRAVENOUS | Status: DC

## 2021-05-16 MED ORDER — ACETAMINOPHEN 500 MG PO TABS
1000.0000 mg | ORAL_TABLET | Freq: Four times a day (QID) | ORAL | Status: DC
  Administered 2021-05-16 – 2021-05-18 (×6): 1000 mg via ORAL
  Filled 2021-05-16 (×7): qty 2

## 2021-05-16 MED ORDER — NALOXONE HCL 0.4 MG/ML IJ SOLN: 0.4000 mg | INTRAMUSCULAR | Status: DC | PRN

## 2021-05-16 MED ORDER — NALBUPHINE HCL 10 MG/ML IJ SOLN: 5.0000 mg | Freq: Once | INTRAMUSCULAR | Status: DC | PRN

## 2021-05-16 MED ORDER — SOD CITRATE-CITRIC ACID 500-334 MG/5ML PO SOLN
30.0000 mL | Freq: Once | ORAL | Status: AC
  Administered 2021-05-16: 30 mL via ORAL

## 2021-05-16 MED ORDER — FENTANYL CITRATE (PF) 100 MCG/2ML IJ SOLN
INTRAMUSCULAR | Status: AC
  Filled 2021-05-16: qty 2

## 2021-05-16 MED ORDER — CEFAZOLIN SODIUM-DEXTROSE 2-4 GM/100ML-% IV SOLN
INTRAVENOUS | Status: AC
  Filled 2021-05-16: qty 100

## 2021-05-16 MED ORDER — LABETALOL HCL 5 MG/ML IV SOLN: 80.0000 mg | INTRAVENOUS | Status: DC | PRN

## 2021-05-16 MED ORDER — POVIDONE-IODINE 10 % EX SWAB: 2.0000 "application " | Freq: Once | CUTANEOUS | Status: DC

## 2021-05-16 MED ORDER — SIMETHICONE 80 MG PO CHEW
80.0000 mg | CHEWABLE_TABLET | Freq: Three times a day (TID) | ORAL | Status: DC
  Administered 2021-05-16 – 2021-05-18 (×6): 80 mg via ORAL
  Filled 2021-05-16 (×6): qty 1

## 2021-05-16 MED ORDER — PHENYLEPHRINE 40 MCG/ML (10ML) SYRINGE FOR IV PUSH (FOR BLOOD PRESSURE SUPPORT)
PREFILLED_SYRINGE | INTRAVENOUS | Status: AC
  Filled 2021-05-16: qty 10

## 2021-05-16 MED ORDER — PROMETHAZINE HCL 25 MG/ML IJ SOLN: 6.2500 mg | INTRAMUSCULAR | Status: DC | PRN

## 2021-05-16 MED ORDER — HYDRALAZINE HCL 20 MG/ML IJ SOLN: 10.0000 mg | INTRAMUSCULAR | Status: DC | PRN

## 2021-05-16 MED ORDER — SIMETHICONE 80 MG PO CHEW: 80.0000 mg | CHEWABLE_TABLET | ORAL | Status: DC | PRN

## 2021-05-16 MED ORDER — LABETALOL HCL 5 MG/ML IV SOLN
INTRAVENOUS | Status: AC
  Filled 2021-05-16: qty 4

## 2021-05-16 MED ORDER — DEXAMETHASONE SODIUM PHOSPHATE 4 MG/ML IJ SOLN
INTRAMUSCULAR | Status: AC
  Filled 2021-05-16: qty 1

## 2021-05-16 MED ORDER — TETANUS-DIPHTH-ACELL PERTUSSIS 5-2.5-18.5 LF-MCG/0.5 IM SUSY: 0.5000 mL | PREFILLED_SYRINGE | Freq: Once | INTRAMUSCULAR | Status: DC

## 2021-05-16 MED ORDER — KETOROLAC TROMETHAMINE 30 MG/ML IJ SOLN
30.0000 mg | Freq: Once | INTRAMUSCULAR | Status: DC | PRN
Start: 2021-05-16 — End: 2021-05-16

## 2021-05-16 MED ORDER — KETOROLAC TROMETHAMINE 30 MG/ML IJ SOLN
INTRAMUSCULAR | Status: DC | PRN
  Administered 2021-05-16: 30 mg via INTRAVENOUS

## 2021-05-16 MED ORDER — PHENYLEPHRINE HCL-NACL 20-0.9 MG/250ML-% IV SOLN
INTRAVENOUS | Status: DC | PRN
  Administered 2021-05-16: 20 ug/min via INTRAVENOUS

## 2021-05-16 MED ORDER — IBUPROFEN 600 MG PO TABS
600.0000 mg | ORAL_TABLET | Freq: Four times a day (QID) | ORAL | Status: DC
  Administered 2021-05-18 (×2): 600 mg via ORAL
  Filled 2021-05-16 (×2): qty 1

## 2021-05-16 MED ORDER — OXYTOCIN-SODIUM CHLORIDE 30-0.9 UT/500ML-% IV SOLN
INTRAVENOUS | Status: DC | PRN
  Administered 2021-05-16: 300 mL via INTRAVENOUS
  Administered 2021-05-16: 100 mL via INTRAVENOUS
  Administered 2021-05-16: 200 mL via INTRAVENOUS

## 2021-05-16 MED ORDER — DIBUCAINE (PERIANAL) 1 % EX OINT: 1.0000 "application " | TOPICAL_OINTMENT | CUTANEOUS | Status: DC | PRN

## 2021-05-16 MED ORDER — NALOXONE HCL 4 MG/10ML IJ SOLN
1.0000 ug/kg/h | INTRAVENOUS | Status: DC | PRN
  Filled 2021-05-16: qty 5

## 2021-05-16 MED ORDER — STERILE WATER FOR IRRIGATION IR SOLN
Status: DC | PRN
  Administered 2021-05-16: 1000 mL

## 2021-05-16 MED ORDER — ZOLPIDEM TARTRATE 5 MG PO TABS: 5.0000 mg | ORAL_TABLET | Freq: Every evening | ORAL | Status: DC | PRN

## 2021-05-16 MED ORDER — MENTHOL 3 MG MT LOZG: 1.0000 | LOZENGE | OROMUCOSAL | Status: DC | PRN

## 2021-05-16 MED ORDER — PRENATAL MULTIVITAMIN CH
1.0000 | ORAL_TABLET | Freq: Every day | ORAL | Status: DC
  Administered 2021-05-17 – 2021-05-18 (×2): 1 via ORAL
  Filled 2021-05-16 (×2): qty 1

## 2021-05-16 MED ORDER — LABETALOL HCL 5 MG/ML IV SOLN: 40.0000 mg | INTRAVENOUS | Status: DC | PRN

## 2021-05-16 MED ORDER — DEXAMETHASONE SODIUM PHOSPHATE 4 MG/ML IJ SOLN
INTRAMUSCULAR | Status: DC | PRN
  Administered 2021-05-16: 10 mg via INTRAVENOUS

## 2021-05-16 MED ORDER — COCONUT OIL OIL
1.0000 "application " | TOPICAL_OIL | Status: DC | PRN
  Administered 2021-05-17: 1 via TOPICAL

## 2021-05-16 MED ORDER — TRANEXAMIC ACID-NACL 1000-0.7 MG/100ML-% IV SOLN
INTRAVENOUS | Status: DC | PRN
  Administered 2021-05-16: 1000 mg via INTRAVENOUS

## 2021-05-16 MED ORDER — ACETAMINOPHEN 10 MG/ML IV SOLN
INTRAVENOUS | Status: DC | PRN
  Administered 2021-05-16: 1000 mg via INTRAVENOUS

## 2021-05-16 MED ORDER — OXYCODONE HCL 5 MG PO TABS
5.0000 mg | ORAL_TABLET | ORAL | Status: DC | PRN
  Administered 2021-05-18: 5 mg via ORAL
  Filled 2021-05-16: qty 1

## 2021-05-16 SURGICAL SUPPLY — 35 items
APL SKNCLS STERI-STRIP NONHPOA (GAUZE/BANDAGES/DRESSINGS) ×1
BENZOIN TINCTURE PRP APPL 2/3 (GAUZE/BANDAGES/DRESSINGS) ×2 IMPLANT
CHLORAPREP W/TINT 26ML (MISCELLANEOUS) ×2 IMPLANT
CLAMP CORD UMBIL (MISCELLANEOUS) IMPLANT
CLOTH BEACON ORANGE TIMEOUT ST (SAFETY) ×2 IMPLANT
DRSG OPSITE POSTOP 4X10 (GAUZE/BANDAGES/DRESSINGS) ×2 IMPLANT
ELECT REM PT RETURN 9FT ADLT (ELECTROSURGICAL) ×2
ELECTRODE REM PT RTRN 9FT ADLT (ELECTROSURGICAL) ×1 IMPLANT
EXTRACTOR VACUUM M CUP 4 TUBE (SUCTIONS) IMPLANT
GLOVE BIO SURGEON STRL SZ7.5 (GLOVE) ×2 IMPLANT
GLOVE BIOGEL PI IND STRL 7.0 (GLOVE) ×1 IMPLANT
GLOVE BIOGEL PI IND STRL 7.5 (GLOVE) ×1 IMPLANT
GLOVE BIOGEL PI INDICATOR 7.0 (GLOVE) ×1
GLOVE BIOGEL PI INDICATOR 7.5 (GLOVE) ×1
GOWN STRL REUS W/TWL LRG LVL3 (GOWN DISPOSABLE) ×4 IMPLANT
KIT ABG SYR 3ML LUER SLIP (SYRINGE) IMPLANT
NDL HYPO 25X5/8 SAFETYGLIDE (NEEDLE) IMPLANT
NEEDLE HYPO 25X5/8 SAFETYGLIDE (NEEDLE) IMPLANT
NS IRRIG 1000ML POUR BTL (IV SOLUTION) ×2 IMPLANT
PACK C SECTION WH (CUSTOM PROCEDURE TRAY) ×2 IMPLANT
PAD OB MATERNITY 4.3X12.25 (PERSONAL CARE ITEMS) ×2 IMPLANT
PENCIL SMOKE EVAC W/HOLSTER (ELECTROSURGICAL) ×2 IMPLANT
RTRCTR C-SECT PINK 25CM LRG (MISCELLANEOUS) ×2 IMPLANT
STRIP CLOSURE SKIN 1/2X4 (GAUZE/BANDAGES/DRESSINGS) ×2 IMPLANT
SUT CHROMIC 2 0 CT 1 (SUTURE) ×3 IMPLANT
SUT MNCRL AB 3-0 PS2 27 (SUTURE) ×2 IMPLANT
SUT PLAIN 2 0 XLH (SUTURE) ×2 IMPLANT
SUT VIC AB 0 CT1 36 (SUTURE) ×2 IMPLANT
SUT VIC AB 0 CTX 36 (SUTURE) ×6
SUT VIC AB 0 CTX36XBRD ANBCTRL (SUTURE) ×3 IMPLANT
SUT VIC AB 2-0 SH 27 (SUTURE)
SUT VIC AB 2-0 SH 27XBRD (SUTURE) IMPLANT
TOWEL OR 17X24 6PK STRL BLUE (TOWEL DISPOSABLE) ×2 IMPLANT
TRAY FOLEY W/BAG SLVR 14FR LF (SET/KITS/TRAYS/PACK) ×2 IMPLANT
WATER STERILE IRR 1000ML POUR (IV SOLUTION) ×2 IMPLANT

## 2021-05-16 NOTE — Lactation Note (Signed)
This note was copied from a baby's chart. Lactation Consultation Note  Patient Name: Teresa Rose MYTRZ'N Date: 05/16/2021 Reason for consult: Initial assessment;Term Age:35 hours  LC in to room for initial visit. "[name]" is very sleepy and uninterested to eat. Noted edematous areola, may need hand pump and /or shells. LC offered expressed colostrum and spit up.  Talked about breastfeeding on demand or 8-12 times in 24h period, expected output, normal newborn behavior and patterns.  Encouraged maternal rest, hydration and food intake.  All questions answered at this time. Provided Lactation services brochure and promoted [employer] booklet information.   Contact LC as needed for feeds/support/concerns/questions.    Maternal Data Has patient been taught Hand Expression?: Yes Does the patient have breastfeeding experience prior to this delivery?: No  Feeding Mother's Current Feeding Choice: Breast Milk  LATCH Score Latch: Too sleepy or reluctant, no latch achieved, no sucking elicited.  Audible Swallowing: None  Type of Nipple: Everted at rest and after stimulation  Comfort (Breast/Nipple): Soft / non-tender  Hold (Positioning): Assistance needed to correctly position infant at breast and maintain latch.  LATCH Score: 5  Interventions Interventions: Breast feeding basics reviewed;Skin to skin;Hand express;Breast massage;Expressed milk;Education (May need hand pump and/or shells due to areolar edema.)  Discharge Pump: Personal WIC Program: No  Consult Status Consult Status: Follow-up Date: 05/17/21 Follow-up type: In-patient    Darren Caldron A Higuera Ancidey 05/16/2021, 7:39 PM

## 2021-05-16 NOTE — Anesthesia Preprocedure Evaluation (Addendum)
Anesthesia Evaluation  Patient identified by MRN, date of birth, ID band Patient awake    Reviewed: Allergy & Precautions, NPO status , Patient's Chart, lab work & pertinent test results  Airway Mallampati: II  TM Distance: >3 FB Neck ROM: Full    Dental  (+) Dental Advisory Given, Teeth Intact   Pulmonary neg pulmonary ROS, Current Smoker,    Pulmonary exam normal breath sounds clear to auscultation       Cardiovascular negative cardio ROS Normal cardiovascular exam Rhythm:Regular Rate:Normal     Neuro/Psych  Headaches,    GI/Hepatic negative GI ROS, Neg liver ROS,   Endo/Other  negative endocrine ROS  Renal/GU negative Renal ROS     Musculoskeletal negative musculoskeletal ROS (+)   Abdominal (+) + obese,   Peds  Hematology negative hematology ROS (+)   Anesthesia Other Findings   Reproductive/Obstetrics (+) Pregnancy                            Anesthesia Physical Anesthesia Plan  ASA: 2  Anesthesia Plan: Spinal   Post-op Pain Management:    Induction: Intravenous  PONV Risk Score and Plan: 3 and Ondansetron, Dexamethasone, Treatment may vary due to age or medical condition and Scopolamine patch - Pre-op  Airway Management Planned: Natural Airway  Additional Equipment: None  Intra-op Plan:   Post-operative Plan:   Informed Consent: I have reviewed the patients History and Physical, chart, labs and discussed the procedure including the risks, benefits and alternatives for the proposed anesthesia with the patient or authorized representative who has indicated his/her understanding and acceptance.     Dental advisory given  Plan Discussed with: CRNA  Anesthesia Plan Comments:        Anesthesia Quick Evaluation

## 2021-05-16 NOTE — Op Note (Signed)
Cesarean Section Procedure Note  Indications: P0 at 51 2/7wks with HSV outbreak scheduled for primary c-section.  Pre-operative Diagnosis: 1.39 2/7wks 2.HERPES SIMPLEX OUTBREAK   Post-operative Diagnosis: 1.39 2/7wks  2.HSV outbreak  Procedure: PRIMARY LOW TRANSVERSE CESAREAN SECTION  Surgeon: Osborn Coho, MD    Assistants: Rhea Pink, CNM  Anesthesia: Regional  Anesthesiologist: [name], MD   Procedure Details  The patient was taken to the operating room secondary to HSV outbreak after the risks, benefits, complications, treatment options, and expected outcomes were discussed with the patient.  The patient concurred with the proposed plan, giving informed consent which was signed and witnessed. The patient was taken to Operating Room C, identified as [employer] and the procedure verified as C-Section Delivery. A Time Out was held and the above information confirmed.  After induction of anesthesia by obtaining a spinal, the patient was prepped and draped in the usual sterile manner. A Pfannenstiel skin incision was made and carried down through the subcutaneous tissue to the underlying layer of fascia.  The fascia was incised bilaterally and extended transversely bilaterally with the Mayo scissors. Kocher clamps were placed on the inferior aspect of the fascial incision and the underlying rectus muscle was separated from the fascia. The same was done on the superior aspect of the fascial incision.  The peritoneum was identified, entered bluntly and extended manually.  An Alexis self-retaining retractor was placed.  The utero-vesical peritoneal reflection was incised transversely and the bladder flap was bluntly freed from the lower uterine segment. A low transverse uterine incision was made with the scalpel and extended bilaterally with the bandage scissors.  The infant was delivered in vertex position without difficulty.  After the umbilical cord was clamped and cut, the infant  was handed to the awaiting pediatricians.  Cord blood was obtained for evaluation.  The placenta was removed intact and appeared to be within normal limits. The uterus was cleared of all clots and debris. The uterine incision was closed with running interlocking sutures of 0 Vicryl and a second imbricating layer was performed as well.   Bilateral tubes and ovaries appeared to be within normal limits.  Good hemostasis was noted.  Copious irrigation was performed until clear.  The peritoneum was repaired with 2-0 chromic via a running suture.  The fascia was reapproximated with a running suture of 0 Vicryl.  The skin was reapproximated with a subcuticular suture of 3-0 monocryl.  Steristrips were applied.  Instrument, sponge, and needle counts were correct prior to abdominal closure and at the conclusion of the case.  The patient was awaiting transfer to the recovery room in good condition.  Findings: Live female infant with Apgars 8 at one minute and 9 at five minutes.  Normal appearing bilateral ovaries and fallopian tubes were noted.  Estimated Blood Loss:  807 ml         Drains: foley to gravity 50 cc          Total IV Fluids: 1800 ml         Specimens to Pathology: none         Complications:  None; patient tolerated the procedure well.         Disposition: PACU - hemodynamically stable.         Condition: stable  Attending Attestation: I performed the procedure.  I was present and scrubbed and the assistant was required due to complexity of anatomy.

## 2021-05-16 NOTE — H&P (Addendum)
OB ADMISSION/ HISTORY & PHYSICAL:  Admission Date: 05/16/2021  9:45 AM  Admit Diagnosis: Primary Cesarean Section  Teresa Rose is a 35 y.o. female G1P0 [redacted]w[redacted]d presenting for primary C/S. Endorses active FM, denies LOF and vaginal bleeding. EFW 9# at 37 weeks.  History of current pregnancy: G1P0   Patient entered care with [facility] at 13+3 wks.   EDC 8/8 by LMP and congruent w/ 9 wk U/S.   Anatomy scan:  20 wks, complete w/ anterior placenta.   Antenatal testing: N/A Last evaluation: 37  wks vertex/ anterior placenta/ AFI 22.5/ EFW 9# 0oz (>98%)  Significant prenatal events:  Patient Active Problem List   Diagnosis Date Noted   HSV-2 infection complicating pregnancy 05/16/2021   RhD negative 05/16/2021   Alpha thalassemia silent carrier 05/16/2021    Prenatal Labs: ABO, Rh: --/--/B NEG (08/01 1115) Antibody: NEG (08/01 1115) Rubella: Immune (01/05 0000)  RPR: NON REACTIVE (08/01 1107)  HBsAg: Negative (01/05 0000)  HIV: Non-reactive (01/05 0000)  GTT: passed 1 hr GBS:   neg GC/CHL: neg/neg Genetics: low-risk, Horizon-alpha thalassemia carrier, AFP normal Tdap/influenza vaccines: tdap 03/01/21, declined flu   OB History  Gravida Para Term Preterm AB Living  1            SAB IAB Ectopic Multiple Live Births               # Outcome Date GA Lbr Len/2nd Weight Sex Delivery Anes PTL Lv  1 Current             Medical / Surgical History: Past medical history:  Past Medical History:  Diagnosis Date   Frequent headaches     Past surgical history:  Past Surgical History:  Procedure Laterality Date   arm surgery Right    EXTERNAL EAR SURGERY     Family History:  Family History  Problem Relation Age of Onset   Hypertension Mother    Heart disease Mother 45       MI   Hypertension Maternal Aunt    Heart disease Maternal Aunt    Hypertension Maternal Uncle    Heart disease Maternal Uncle    Hypertension Paternal Grandfather    Stroke Neg Hx     Social History:  reports  that she has been smoking cigarettes. She has been smoking an average of .25 packs per day. She has never used smokeless tobacco. She reports that she does not drink alcohol and does not use drugs.  Allergies: Patient has no known allergies.   Current Medications at time of admission:  Prior to Admission medications   Medication Sig Start Date End Date Taking? Authorizing Provider  nitrofurantoin, macrocrystal-monohydrate, (MACROBID) 100 MG capsule Take 100 mg by mouth every evening. 05/04/21  Yes [provider]  Prenatal Vit-Fe Fumarate-FA (PRENATAL PO) Take 1 tablet by mouth every evening.   Yes [provider]  valACYclovir (VALTREX) 1000 MG tablet Take 500 mg by mouth in the morning. 04/13/21  Yes [provider]  nystatin ointment (MYCOSTATIN) Apply 1 application topically in the morning and at bedtime. 05/04/21   [provider]    Review of Systems: Constitutional: Negative   HENT: Negative   Eyes: Negative   Respiratory: Negative   Cardiovascular: Negative   Gastrointestinal: Negative  Genitourinary: neg for bloody show, neg for LOF   Musculoskeletal: Negative   Skin: Negative   Neurological: Negative   Endo/Heme/Allergies: Negative   Psychiatric/Behavioral: Negative    Physical Exam: VS: Blood pressure Marland Kitchen)  165/109, pulse 100.  AAO x3, no signs of distress Cardiovascular: RRR Respiratory: Lung fields clear to ausculation GU/GI: Abdomen gravid, non-tender, non-distended, active FM Extremities: negative for pain, tenderness, and cords  Cervical exam: deferred FHR: Doppler 120    Prenatal Transfer Tool  Maternal Diabetes: No Genetic Screening: Normal ow-risk, Horizon-alpha thalassemia carrier, AFP normal Maternal Ultrasounds/Referrals: Normal Fetal Ultrasounds or other Referrals:  None Maternal Substance Abuse:  No Significant Maternal Medications:  None Significant Maternal Lab Results: Group B Strep  negative    Assessment: 35 y.o. G1P0 [redacted]w[redacted]d Primary Cesarean Section GBS negative Pain management plan: per anesthesia   Plan:  Admit to L&D pre-op Routine admission orders  Dr Su Hilt notified of admission and plan of care  Roma Schanz MSN, CNM 05/16/2021 10:08 AM  Pt presenting for primary c-section d/t herpes outbreak.  Risks benefits alternatives reviewed with the patient including but not limited to bleeding infection injury, questions answered and consent signed and witnessed.  The pt had elevated BPs in the severe range today upon presentation and has required labetalol for severe range BPs.  She denies any sxs.  Labs and urine PCR ordered.  Discussed with patient 24hrs of mg post partum and she is agreeable.  Pt retested for Covid because she was asymptomatic and felt the [facility] test was a false positive.  Subsequent rapid and PCR testing returned negative and proof shown to [facility] staff and reported to me earlier this morning.

## 2021-05-16 NOTE — Progress Notes (Signed)
Pt had a covid positive result in chart 05/14/21. No symptoms now or prior to test. Believed false positive and had a rapid and PCR test done through [facility] which were both negative on 05/15/21.  [facility], [place]inda notified as well as Dr. Su Hilt. Ok to come off of precautions. Negative results will be scanned in to chart.

## 2021-05-16 NOTE — Anesthesia Procedure Notes (Addendum)
Spinal  Patient location during procedure: OR Start time: 05/16/2021 11:50 AM End time: 05/16/2021 11:58 AM Reason for block: surgical anesthesia Staffing Performed: anesthesiologist  Anesthesiologist: Nolon Nations, MD Preanesthetic Checklist Completed: patient identified, IV checked, site marked, risks and benefits discussed, surgical consent, monitors and equipment checked, pre-op evaluation and timeout performed Spinal Block Patient position: sitting Prep: DuraPrep and site prepped and draped Patient monitoring: heart rate, continuous pulse ox and blood pressure Location: L3-4 Injection technique: single-shot Needle Needle type: Spinocan  Needle gauge: 25 G Needle length: 9 cm Additional Notes Expiration date of kit checked and confirmed. Patient tolerated procedure well, without complications.

## 2021-05-16 NOTE — Transfer of Care (Signed)
Immediate Anesthesia Transfer of Care Note  Patient: Teresa Rose  Procedure(s) Performed: CESAREAN SECTION  Patient Location: PACU  Anesthesia Type:Epidural  Level of Consciousness: awake, alert  and oriented  Airway & Oxygen Therapy: Patient Spontanous Breathing  Post-op Assessment: Report given to RN and Post -op Vital signs reviewed and stable  Post vital signs: Reviewed and stable  Last Vitals:  Vitals Value Taken Time  BP 123/84 05/16/21 1415  Temp    Pulse    Resp 22 05/16/21 1417  SpO2    Vitals shown include unvalidated device data.  Last Pain:  Vitals:   05/16/21 1000  TempSrc: Oral         Complications: No notable events documented.

## 2021-05-17 ENCOUNTER — Encounter (HOSPITAL_COMMUNITY): Payer: Self-pay | Admitting: Obstetrics and Gynecology

## 2021-05-17 LAB — CBC
HCT: 25.9 % — ABNORMAL LOW (ref 36.0–46.0)
Hemoglobin: 8 g/dL — ABNORMAL LOW (ref 12.0–15.0)
MCH: 25.4 pg — ABNORMAL LOW (ref 26.0–34.0)
MCHC: 30.9 g/dL (ref 30.0–36.0)
MCV: 82.2 fL (ref 80.0–100.0)
Platelets: 242 10*3/uL (ref 150–400)
RBC: 3.15 MIL/uL — ABNORMAL LOW (ref 3.87–5.11)
RDW: 15.7 % — ABNORMAL HIGH (ref 11.5–15.5)
WBC: 13.4 10*3/uL — ABNORMAL HIGH (ref 4.0–10.5)
nRBC: 0 % (ref 0.0–0.2)

## 2021-05-17 MED ORDER — RHO D IMMUNE GLOBULIN 1500 UNIT/2ML IJ SOSY
300.0000 ug | PREFILLED_SYRINGE | Freq: Once | INTRAMUSCULAR | Status: AC
  Administered 2021-05-17: 300 ug via INTRAVENOUS
  Filled 2021-05-17: qty 2

## 2021-05-17 MED ORDER — LACTATED RINGERS IV BOLUS
1000.0000 mL | Freq: Once | INTRAVENOUS | Status: AC
  Administered 2021-05-17: 1000 mL via INTRAVENOUS

## 2021-05-17 MED ORDER — BUPIVACAINE IN DEXTROSE 0.75-8.25 % IT SOLN
INTRATHECAL | Status: DC | PRN
  Administered 2021-05-16: 1.7 mL via INTRATHECAL

## 2021-05-17 MED ORDER — FENTANYL CITRATE (PF) 100 MCG/2ML IJ SOLN
INTRAMUSCULAR | Status: DC | PRN
  Administered 2021-05-16: 15 ug via INTRATHECAL

## 2021-05-17 MED ORDER — MORPHINE SULFATE (PF) 0.5 MG/ML IJ SOLN
INTRAMUSCULAR | Status: DC | PRN
  Administered 2021-05-16: 150 ug via INTRATHECAL

## 2021-05-17 NOTE — Progress Notes (Signed)
Subjective: POD# 1 Information for the patient's newborn:  Teresa, Rose [[id number]]  female   Baby's Name Teresa Rose Circumcision in-pt  Reports feeling good Feeding: breast Reports tolerating PO and denies N/V, foley remains in place, ambulating encouraged after removal of foley Pain controlled with  IV pain meds Denies HA/SOB/dizziness  Flatus not passing Vaginal bleeding is normal, no clots     Objective:  VS:  Vitals:   05/16/21 1720 05/16/21 2138 05/17/21 0117 05/17/21 0526  BP: 137/89  (!) 129/92 (!) 134/97  Pulse: 67  65 (!) 57  Resp: 18 18 18 18   Temp: 98.5 F (36.9 C) 98.7 F (37.1 C) 98.5 F (36.9 C) 98.4 F (36.9 C)  TempSrc: Oral Oral Oral Oral  SpO2: 100% 100% 100% 100%    Intake/Output Summary (Last 24 hours) at 05/17/2021 07/17/2021 Last data filed at 05/17/2021 0526 Gross per 24 hour  Intake 3136.78 ml  Output 2232 ml  Net 904.78 ml     Recent Labs    05/16/21 1036 05/17/21 0353  WBC 8.1 13.4*  HGB 10.3* 8.0*  HCT 33.0* 25.9*  PLT 297 242    Blood type: --/--/B NEG (08/01 1115) Rubella: Immune (01/05 0000)    Physical Exam:  General: alert, cooperative, and no distress CV: Regular rate and rhythm Resp: clear Abdomen: soft, nontender, normal bowel sounds Incision: clean, dry, and intact Uterine Fundus: firm, below umbilicus, nontender Lochia:  appropriate Ext:  1+ edema to BLE, neg for pain, tenderness, or cords   Assessment/Plan: 35 y.o.   POD# 1. G1P1001                  Active Problems:   S/P cesarean section   RhD negative   Alpha thalassemia silent carrier   Herpes infection during pregnancy in third trimester   Routine post-op PP care          Rhogam todam, newborn in Rh pos Advance diet as tolerated Advised warm fluids and ambulation to improve GI motility Breastfeeding support Anticipate D/C on POD 2 or 3  20, MSN, CNM 05/17/2021, 9:37 AM

## 2021-05-17 NOTE — Lactation Note (Addendum)
This note was copied from a baby's chart. Lactation Consultation Note  Patient Name: Teresa Rose [id number] Date: 05/17/2021 Reason for consult: Follow-up assessment;Mother's request;Difficult latch;Primapara;1st time breastfeeding Age:35 hours  Mom stated infant sleepy and not latching since circ. LC attempted latch but infant did not wake. We did some suck training to bring his tongue down and flange out his lips. Infant fed EBM via bottle and spoon for this visit.  Mom requested DEBP so she can start supplementing her breast milk with a bottle after latching. Breastfeeding supplementation guide provided  Plan 1. To feed based on cues 8-12x in 24 hr period no more than 4 hrs without a feeding. Mom to offer both breasts, STS and look for signs of milk transfer.  2. Mom to pace bottle feed her EBM after latching using slow flow nipple. 3. DEBP q 3hrs for 15 min with 24 and 27 flange 4. Mom to call for latch assistance for next feeding to work on latching in left breast. Mom use her manual pump to pre pump to extend the nipple before latching.   All questions answered at the end of the feeding.   Maternal Data Has patient been taught Hand Expression?: Yes Does the patient have breastfeeding experience prior to this delivery?: No  Feeding Mother's Current Feeding Choice: Breast Milk  LATCH Score                    Lactation Tools Discussed/Used Tools: Pump;Flanges (Infant not able to latch on left side according to mother. mom to pre pump before attempting latch on that side. If continues to struggle, LC will try a 20 NS. Mom to call for latch assistance for next feeding.) Flange Size: 24;27 (left breast larger than right) Breast pump type: Double-Electric Breast Pump Pump Education: Setup, frequency, and cleaning;Milk Storage Reason for Pumping: increase stimulation, mom requested pump to start supplementing her EBM via bottle Pumping frequency: every 3 hrs for 15  min  Interventions Interventions: Breast feeding basics reviewed;Breast compression;Assisted with latch;Adjust position;Skin to skin;Support pillows;DEBP;Position options;Hand express;Expressed milk;Education;Pre-pump if needed  Discharge Pump: Personal WIC Program: No  Consult Status Consult Status: Follow-up Date: 05/18/21 Follow-up type: In-patient    Teresa Rose  Teresa Rose 05/17/2021, 5:05 PM

## 2021-05-17 NOTE — Anesthesia Postprocedure Evaluation (Signed)
Anesthesia Post Note  Patient: Teresa Rose  Procedure(s) Performed: CESAREAN SECTION     Patient location during evaluation: PACU Anesthesia Type: Spinal Level of consciousness: awake and alert Pain management: pain level controlled Vital Signs Assessment: post-procedure vital signs reviewed and stable Respiratory status: spontaneous breathing Cardiovascular status: stable Anesthetic complications: no   No notable events documented.  Last Vitals:  Vitals:   05/17/21 0526 05/17/21 1116  BP: (!) 134/97 139/85  Pulse: (!) 57 80  Resp: 18 18  Temp: 36.9 C 36.9 C  SpO2: 100% 100%    Last Pain:  Vitals:   05/17/21 1116  TempSrc: Oral  PainSc:                  [name]

## 2021-05-18 DIAGNOSIS — D62 Acute posthemorrhagic anemia: Secondary | ICD-10-CM | POA: Diagnosis not present

## 2021-05-18 LAB — RH IG WORKUP (INCLUDES ABO/RH)
Fetal Screen: NEGATIVE
Gestational Age(Wks): 39
Unit division: 0

## 2021-05-18 MED ORDER — POLYSACCHARIDE IRON COMPLEX 150 MG PO CAPS: 150.0000 mg | ORAL_CAPSULE | Freq: Every day | ORAL | 3 refills | Status: DC

## 2021-05-18 MED ORDER — IBUPROFEN 600 MG PO TABS: 600.0000 mg | ORAL_TABLET | Freq: Four times a day (QID) | ORAL | 0 refills | Status: DC

## 2021-05-18 MED ORDER — OXYCODONE HCL 5 MG PO TABS: 5.0000 mg | ORAL_TABLET | ORAL | 0 refills | Status: DC | PRN

## 2021-05-18 MED ORDER — POLYSACCHARIDE IRON COMPLEX 150 MG PO CAPS: 150.0000 mg | ORAL_CAPSULE | Freq: Every day | ORAL | Status: DC

## 2021-05-18 NOTE — Lactation Note (Signed)
This note was copied from a baby's chart. Lactation Consultation Note  Patient Name: Teresa Rose [id number] Date: 05/18/2021 Reason for consult: Follow-up assessment;Term Age:35 hours  LC in to room for follow up. Mother reports difficulty latching to left breast, "Teresa Rose" prefers right breast. Mother is pumping right breast upon LC arrival and collected ~5 mL. Mother expresses concern because she may not have enough volume  to satisfy infant's needs. LC assisted with latch reviewing positioning, alignment. Demonstrated deep, asymmetrical latch as well as ways to keep infant awake at breast.  Talked about milk coming into volume. Mother's milk seems transitional and she reports breast feeling heavier.    Feeding plan: 1-Breastfeeding on demand or 8-12 times in 24h period. 2-Keep infant awake during breastfeeding session: massaging breast, infant's hand/shoulder/feet 3-Monitor voids and stools as signs good intake.  4-Encouraged maternal rest, hydration and food intake.     All questions answered at this time. Contact LC as needed for feeds/support/concerns/questions  Maternal Data Has patient been taught Hand Expression?: Yes  Feeding Mother's Current Feeding Choice: Breast Milk   LATCH Score Latch: Grasps breast easily, tongue down, lips flanged, rhythmical sucking.  Audible Swallowing: Spontaneous and intermittent  Type of Nipple: Everted at rest and after stimulation  Comfort (Breast/Nipple): Filling, red/small blisters or bruises, mild/mod discomfort (encouraged to massage due to early engorgement signs)  Hold (Positioning): Assistance needed to correctly position infant at breast and maintain latch.  LATCH Score: 8   Lactation Tools Discussed/Used Tools: Pump Breast pump type: Double-Electric Breast Pump;Manual Reason for Pumping: stimulation and supplementation Pumping frequency: after feedings. Pump & offer EBM after breastfeeding to "top infant up". Pumped  volume: 5 mL  Interventions Interventions: Breast feeding basics reviewed;Education;Assisted with latch;Skin to skin;Breast massage;Hand express;Adjust position;DEBP;Expressed milk;Position options;Breast compression  Discharge Discharge Education: Engorgement and breast care;Warning signs for feeding baby Pump: Personal  Consult Status Consult Status: Complete Date: 05/18/21 Follow-up type: Call as needed    Teresa Rose 05/18/2021, 4:01 PM

## 2021-05-18 NOTE — Discharge Summary (Signed)
PCS OB Discharge Summary     Patient Name: Teresa Rose DOB: 07-28-1986 MRN: [id number]  Date of admission: 05/16/2021 Delivering MD: Osborn Coho  Date of delivery: 8/3 Type of delivery: PCS  Newborn Data: Sex: Baby female Circumcision: Done on 8/4 Live born female  Birth Weight: 8 lb 6.4 oz (3810 g) APGAR: 8, 9  Newborn Delivery   Birth date/time: 05/16/2021 12:32:00 Delivery type: C-Section, Low Transverse Trial of labor: No C-section categorization: Primary      Feeding: breast and bottle Infant being discharge to home with mother in stable condition.   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 female, G1P1001, who presents at [redacted]w[redacted]d weeks gestation. 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 yo G1P1001 at [redacted]w[redacted]d was admitted to the hospital 05/16/2021 for scheduled cesarean section with the following indication:Active HSV.Delivery details are as follows:  Membrane Rupture Time/Date: 12:32 PM ,05/16/2021   Delivery Method:C-Section, Low Transverse  Details of operation can be found in separate operative note.  Patient had an uncomplicated postpartum course.  She is ambulating, tolerating a regular diet, passing flatus, and urinating well. Patient is discharged home in stable condition on  05/18/21        Newborn Data: Birth date:05/16/2021  Birth time:12:32 PM  Gender:Female  Living status:Living  Apgars:8 ,9  Weight:3810 g    Postpartum PODay # 2 :   Hospital Course--Scheduled Cesarean: Patient was admitted on 8/3 for a scheduled Primary cesarean delivery for active HSV lesion.   She was taken to the operating room, where Dr. Arita Miss performed a Primary LTCS under spinal anesthesia, with delivery of a viable baby female, in pt circ completed 8/4,  with weight and Apgars as listed below. Infant was in good condition and remained at the patient's bedside.  The patient was taken to recovery in good condition.  Patient planned to breast and bottle feed.  On post-op day 1, patient was doing well, tolerating a regular diet, with Hgb drop of 10.3-8.0 with EBL of , pt asymptomatic, on po iron, declined IV Iron trasnfusion.  Throughout her stay, her physical exam was WNL, her incision was CDI, and her vital signs remained stable.  By post-op day 1, she was up ad lib, tolerating a regular diet, with good pain control with po med.  She was deemed to have received the full benefit of her hospital stay, and was discharged home in stable condition.  Contraceptive choice was undecided.  Pt RH- newborn +, pt received  rhogam PP on 8/4  Physical exam  Vitals:   05/17/21 1116 05/17/21 1304 05/17/21 2058 05/18/21 0515  BP: 139/85 (!) 139/93 130/73 137/80  Pulse: 80 77 85 86  Resp: 18 14 16 18   Temp: 98.5 F (36.9 C) 98.3 F (36.8 C) 99.4 F (37.4 C) 98.4 F (36.9 C)  TempSrc: Oral Oral Oral Oral  SpO2: 100% 100% 100% 100%   General: alert, cooperative, and no distress Lochia: appropriate Uterine Fundus: firm Incision: Healing well with no significant drainage, No significant erythema, Dressing is clean, dry, and intact, honeycomb dressing CDI Perineum: intact DVT Evaluation: No evidence of DVT seen on physical exam. Negative Homan's sign. No cords or calf tenderness. No significant calf/ankle edema.  Labs: Lab Results  Component Value Date   WBC 13.4 (H) 05/17/2021   HGB 8.0 (L) 05/17/2021   HCT 25.9 (L) 05/17/2021   MCV 82.2 05/17/2021   PLT 242 05/17/2021   CMP Latest Ref Rng & Units 05/16/2021  Glucose 70 - 99 mg/dL 83  BUN 6 - 20 mg/dL 6  Creatinine 07/16/2021 - 7.82 mg/dL 9.56  Sodium 2.13 - 086 mmol/L 135  Potassium 3.5 - 5.1 mmol/L 3.7  Chloride 98 - 111 mmol/L 106  CO2 22 - 32 mmol/L 21(L)  Calcium 8.9 - 10.3 mg/dL 8.9  Total Protein 6.5 - 8.1 g/dL 578)  Total Bilirubin 0.3 - 1.2 mg/dL 0.6  Alkaline Phos 38 - 126 U/L 143(H)  AST 15 - 41 U/L 15  ALT 0 - 44 U/L 11    Date of discharge: 05/18/2021 Discharge Diagnoses: Term Pregnancy-delivered Discharge instruction: per After Visit Summary and "Baby and Me Booklet".  After visit meds:   Activity:           unrestricted and pelvic rest Advance as tolerated. Pelvic rest for 6 weeks.  Diet:                routine Medications: PNV, Ibuprofen, Colace, Iron, and oxy IR Postpartum contraception: Undecided Condition:  Pt discharge to home with baby in stable and condition  Anemia: PO Iron   Meds: Allergies as of 05/18/2021   No Known Allergies      Medication List     STOP taking these medications    nitrofurantoin  (macrocrystal-monohydrate) 100 MG capsule Commonly known as: MACROBID   nystatin ointment Commonly known as: MYCOSTATIN   valACYclovir 1000 MG tablet Commonly known as: VALTREX       TAKE these medications    ibuprofen 600 MG tablet Commonly known as: ADVIL Take 1 tablet (600 mg total) by mouth every 6 (six) hours.   iron polysaccharides 150 MG capsule Commonly known as:  NIFEREX Take 1 capsule (150 mg total) by mouth daily.   oxyCODONE 5 MG immediate release tablet Commonly known as: Oxy IR/ROXICODONE Take 1 tablet (5 mg total) by mouth every 4 (four) hours as needed for moderate pain.   PRENATAL PO Take 1 tablet by mouth every evening.               Discharge Care Instructions  (From admission, onward)           Start     Ordered   05/18/21 0000  Discharge wound care:       Comments: Take dressing off on day 5-7 postpartum.  Report increased drainage, redness or warmth. Clean with water, let soap trickle down body. Can leave steri strips on until they fall off or take them off gently at day 10. Keep open to air, clean and dry.   05/18/21 1124            Discharge Follow Up:   Follow-up Information     Osborn Coho, MD. Schedule an appointment as soon as possible for a visit in 6 week(s).   Specialty: Obstetrics and Gynecology Contact information: [address] [place] [address] [phone]                  [place], NP-C, CNM 05/18/2021, 11:26 AM  Dale Bolton, FNP

## 2021-05-30 ENCOUNTER — Telehealth (HOSPITAL_COMMUNITY): Payer: Self-pay

## 2021-05-30 NOTE — Telephone Encounter (Signed)
"  I'm doing good." Patient declines any questions or concerns about her healing.  "He is doing really good. We go back to the pediatrician on Friday. He is gaining his weight back well. He sleeps really good. Sometimes I have to wake him up to eat. He sleeps on me. We have a bassinet and a crib for him." RN sharedsafe sleep and SIDS prevention education with patient. Patient declines any questions or concerns about baby.  EPDS score is 5.  [name] 05/30/2021,1843

## 2021-06-01 ENCOUNTER — Inpatient Hospital Stay (HOSPITAL_COMMUNITY)
Admission: AD | Admit: 2021-06-01 | Discharge: 2021-06-01 | Disposition: A | Discharge status: Home / Self Care | Payer: BC Managed Care – PPO | Attending: Obstetrics & Gynecology | Admitting: Obstetrics & Gynecology

## 2021-06-01 ENCOUNTER — Other Ambulatory Visit: Payer: Self-pay

## 2021-06-01 DIAGNOSIS — Z8249 Family history of ischemic heart disease and other diseases of the circulatory system: Secondary | ICD-10-CM | POA: Diagnosis not present

## 2021-06-01 DIAGNOSIS — O99335 Smoking (tobacco) complicating the puerperium: Secondary | ICD-10-CM | POA: Diagnosis not present

## 2021-06-01 DIAGNOSIS — O165 Unspecified maternal hypertension, complicating the puerperium: Secondary | ICD-10-CM

## 2021-06-01 DIAGNOSIS — F17210 Nicotine dependence, cigarettes, uncomplicated: Secondary | ICD-10-CM | POA: Insufficient documentation

## 2021-06-01 LAB — COMPREHENSIVE METABOLIC PANEL WITH GFR
ALT: 15 U/L (ref 0–44)
AST: 19 U/L (ref 15–41)
Albumin: 3.7 g/dL (ref 3.5–5.0)
Alkaline Phosphatase: 102 U/L (ref 38–126)
Anion gap: 10 (ref 5–15)
BUN: 15 mg/dL (ref 6–20)
CO2: 22 mmol/L (ref 22–32)
Calcium: 8.9 mg/dL (ref 8.9–10.3)
Chloride: 102 mmol/L (ref 98–111)
Creatinine, Ser: 0.8 mg/dL (ref 0.44–1.00)
GFR, Estimated: >60 mL/min
Glucose, Bld: 103 mg/dL — ABNORMAL HIGH (ref 70–99)
Potassium: 3.6 mmol/L (ref 3.5–5.1)
Sodium: 134 mmol/L — ABNORMAL LOW (ref 135–145)
Total Bilirubin: 0.5 mg/dL (ref 0.3–1.2)
Total Protein: 6.9 g/dL (ref 6.5–8.1)

## 2021-06-01 LAB — CBC
HCT: 37.5 % (ref 36.0–46.0)
Hemoglobin: 11.1 g/dL — ABNORMAL LOW (ref 12.0–15.0)
MCH: 24.7 pg — ABNORMAL LOW (ref 26.0–34.0)
MCHC: 29.6 g/dL — ABNORMAL LOW (ref 30.0–36.0)
MCV: 83.3 fL (ref 80.0–100.0)
Platelets: 406 K/uL — ABNORMAL HIGH (ref 150–400)
RBC: 4.5 MIL/uL (ref 3.87–5.11)
RDW: 15.6 % — ABNORMAL HIGH (ref 11.5–15.5)
WBC: 7.9 K/uL (ref 4.0–10.5)
nRBC: 0 % (ref 0.0–0.2)

## 2021-06-01 MED ORDER — LABETALOL HCL 5 MG/ML IV SOLN
20.0000 mg | INTRAVENOUS | Status: DC | PRN
  Filled 2021-06-01: qty 4

## 2021-06-01 MED ORDER — LABETALOL HCL 5 MG/ML IV SOLN: 40.0000 mg | INTRAVENOUS | Status: DC | PRN

## 2021-06-01 MED ORDER — ACETAMINOPHEN 325 MG PO TABS
650.0000 mg | ORAL_TABLET | Freq: Once | ORAL | Status: AC
  Administered 2021-06-01: 650 mg via ORAL
  Filled 2021-06-01: qty 2

## 2021-06-01 MED ORDER — NIFEDIPINE ER OSMOTIC RELEASE 30 MG PO TB24: 30.0000 mg | ORAL_TABLET | Freq: Every day | ORAL | 2 refills | Status: DC

## 2021-06-01 MED ORDER — NIFEDIPINE ER OSMOTIC RELEASE 30 MG PO TB24
30.0000 mg | ORAL_TABLET | Freq: Once | ORAL | Status: AC
  Administered 2021-06-01: 30 mg via ORAL
  Filled 2021-06-01: qty 1

## 2021-06-01 MED ORDER — HYDRALAZINE HCL 20 MG/ML IJ SOLN: 10.0000 mg | INTRAMUSCULAR | Status: DC | PRN

## 2021-06-01 MED ORDER — LABETALOL HCL 5 MG/ML IV SOLN: 80.0000 mg | INTRAVENOUS | Status: DC | PRN

## 2021-06-01 NOTE — MAU Provider Note (Addendum)
History     CSN: [id number]  Arrival date and time: 06/01/21 1951   Event Date/Time   First Provider Initiated Contact with Patient 06/01/21 2005      Chief Complaint  Patient presents with   Hypertension   Headache   HPI Teresa Rose is a 35 y.o. G1P1001 post-op patient who presents to MAU with chief complaint of elevated blood pressure. Patient denies history of hypertension during her pregnancy. She was found to have mildly elevated blood pressure while visiting the pediatrician earlier today.  Patient also c/o intermittent mild headache throughout the day today. Pain score on arrival to MAU is 3/10. Pain is anterior, bilateral and does not radiate. She has not taken medication or tried other treatments for this complaint. She denies visual disturbances, RUQ/epigastric pain, new onset swelling or weight gain.  Patient is s/p primary cesarean for active HSV lesion on 05/16/2021. She is bottle feeding expressed breast milk.  Patient receives care with [facility].  OB History     Gravida  1   Para  1   Term  1   Preterm      AB      Living  1      SAB      IAB      Ectopic      Multiple  0   Live Births  1           Past Medical History:  Diagnosis Date   Frequent headaches     Past Surgical History:  Procedure Laterality Date   arm surgery Right    CESAREAN SECTION N/A 05/16/2021   Procedure: CESAREAN SECTION;  Surgeon: Osborn Coho, MD;  Location: [facility] LD ORS;  Service: Obstetrics;  Laterality: N/A;   EXTERNAL EAR SURGERY      Family History  Problem Relation Age of Onset   Hypertension Mother    Heart disease Mother 73       MI   Hypertension Maternal Aunt    Heart disease Maternal Aunt    Hypertension Maternal Uncle    Heart disease Maternal Uncle    Hypertension Paternal Grandfather    Stroke Neg Hx     Social History   Tobacco Use   Smoking status: Every Day    Packs/day: 0.25    Types: Cigarettes   Smokeless tobacco: Never   Substance Use Topics   Alcohol use: No    Comment: social   Drug use: No    Allergies: No Known Allergies  Medications Prior to Admission  Medication Sig Dispense Refill Last Dose   ibuprofen (ADVIL) 600 MG tablet Take 1 tablet (600 mg total) by mouth every 6 (six) hours. 30 tablet 0    iron polysaccharides (NIFEREX) 150 MG capsule Take 1 capsule (150 mg total) by mouth daily. 30 capsule 3    oxyCODONE (OXY IR/ROXICODONE) 5 MG immediate release tablet Take 1 tablet (5 mg total) by mouth every 4 (four) hours as needed for moderate pain. 15 tablet 0    Prenatal Vit-Fe Fumarate-FA (PRENATAL PO) Take 1 tablet by mouth every evening.       Review of Systems  Neurological:  Positive for headaches.  All other systems reviewed and are negative. Physical Exam   Blood pressure (!) 170/101, pulse 87, temperature 98 F (36.7 C), resp. rate 18, height 5\' 8"  (1.727 m), weight 90.8 kg, SpO2 100 %, unknown if currently breastfeeding.  Physical Exam Vitals and nursing note reviewed.  Constitutional:  General: She is not in acute distress.    Appearance: She is well-developed. She is not ill-appearing.  Cardiovascular:     Rate and Rhythm: Normal rate and regular rhythm.     Heart sounds: Normal heart sounds.  Pulmonary:     Effort: Pulmonary effort is normal.     Breath sounds: Normal breath sounds.  Abdominal:     Palpations: Abdomen is soft.     Comments: Presence of lower abdominal incision s/p primary cesarean  Skin:    General: Skin is warm and dry.     Capillary Refill: Capillary refill takes less than 2 seconds.  Neurological:     Mental Status: She is alert and oriented to person, place, and time.  Psychiatric:        Mood and Affect: Mood normal.        Speech: Speech normal.        Behavior: Behavior normal.    MAU Course  Procedures  --Inpatient course reviewed. Mild blood pressure elevation during inpatient post-op course, normotensive day of discharge. Not seen  in office since hospital discharge 05/18/2021 --Patient MSE'd in triage by CNM due to severe range BP on arrival to MAU --Severe range blood pressure x 1 on arrival to MAU. Otherwise mildly elevated --Mild headache resolved with PO Tylenol --Discussed with Dr. Charlotta Newton and subsequently Dr. Mora Appl, who both agree patient is appropriate for discharge home on Procardia 30 XL daily with strict return precautions. Per Dr. Mora Appl, blood pressure check in office coordinated with [facility], CNM prior to patient discharge from MAU. Pt aware she should expect contact from [facility] Monday. --First Procardia 30 XL given in MAU.   Orders Placed This Encounter  Procedures   CBC   Comprehensive metabolic panel   Urinalysis, Routine w reflex microscopic Urine, Clean Catch   Notify physician (specify) Confirmatory reading of BP> 160/110 15 minutes later   Measure blood pressure   Airborne and Contact precautions   Insert peripheral IV   Discharge patient   Patient Vitals for the past 24 hrs:  BP Temp Pulse Resp SpO2 Height Weight  06/01/21 2209 (!) 145/96 -- -- -- -- -- --  06/01/21 2131 (!) 145/96 -- 69 -- -- -- --  06/01/21 2116 (!) 149/88 -- 70 -- -- -- --  06/01/21 2101 (!) 151/87 -- 75 -- -- -- --  06/01/21 2046 (!) 155/93 -- 82 -- -- -- --  06/01/21 2031 (!) 140/93 -- 79 -- -- -- --  06/01/21 2019 (!) 153/97 -- 82 -- -- -- --  06/01/21 2001 (!) 170/101 98 F (36.7 C) 87 18 100 % 5\' 8"  (1.727 m) 90.8 kg   Results for orders placed or performed during the hospital encounter of 06/01/21 (from the past 24 hour(s))  CBC     Status: Abnormal   Collection Time: 06/01/21  8:30 PM  Result Value Ref Range   WBC 7.9 4.0 - 10.5 K/uL   RBC 4.50 3.87 - 5.11 MIL/uL   Hemoglobin 11.1 (L) 12.0 - 15.0 g/dL   HCT 06/03/21 36.4 - 68.0 %   MCV 83.3 80.0 - 100.0 fL   MCH 24.7 (L) 26.0 - 34.0 pg   MCHC 29.6 (L) 30.0 - 36.0 g/dL   RDW 32.1 (H) 22.4 - 82.5 %   Platelets 406 (H) 150 - 400 K/uL   nRBC 0.0 0.0 - 0.2 %   Comprehensive metabolic panel     Status: Abnormal   Collection Time: 06/01/21  8:30 PM  Result Value Ref Range   Sodium 134 (L) 135 - 145 mmol/L   Potassium 3.6 3.5 - 5.1 mmol/L   Chloride 102 98 - 111 mmol/L   CO2 22 22 - 32 mmol/L   Glucose, Bld 103 (H) 70 - 99 mg/dL   BUN 15 6 - 20 mg/dL   Creatinine, Ser 6.14 0.44 - 1.00 mg/dL   Calcium 8.9 8.9 - 43.1 mg/dL   Total Protein 6.9 6.5 - 8.1 g/dL   Albumin 3.7 3.5 - 5.0 g/dL   AST 19 15 - 41 U/L   ALT 15 0 - 44 U/L   Alkaline Phosphatase 102 38 - 126 U/L   Total Bilirubin 0.5 0.3 - 1.2 mg/dL   GFR, Estimated >54 >00 mL/min   Anion gap 10 5 - 15   Meds ordered this encounter  Medications   AND Linked Order Group    labetalol (NORMODYNE) injection 20 mg    labetalol (NORMODYNE) injection 40 mg    labetalol (NORMODYNE) injection 80 mg    hydrALAZINE (APRESOLINE) injection 10 mg   acetaminophen (TYLENOL) tablet 650 mg   NIFEdipine (PROCARDIA-XL/NIFEDICAL-XL) 24 hr tablet 30 mg   NIFEdipine (PROCARDIA-XL/NIFEDICAL-XL) 30 MG 24 hr tablet    Sig: Take 1 tablet (30 mg total) by mouth daily. Can increase to twice a day as needed for symptomatic contractions    Dispense:  30 tablet    Refill:  2    Order Specific Question:   Supervising Provider    Answer:   Myna Hidalgo [[id number]]    Assessment and Plan  --35 y.o. G1P1001 s/p primary cesarean 05/16/2021 --Postpartum HTN, PEC labs normal --Pain score 0/10 prior to MAU discharge --S/p consult with Drs Charlotta Newton and Mora Appl --Initiate Procardia 30 XL daily for postpartum HTN --Discharge home in stable condition with strict return precautions  F/U: --BP check at [facility] Monday 06/04/2021  Calvert Cantor, CNM 06/01/2021, 10:24 PM

## 2021-06-01 NOTE — MAU Note (Signed)
Pt c/o elevated BP last friday and today. Slight headache but did not take any meds. BP 150/100 at peds office today.

## 2021-07-19 ENCOUNTER — Telehealth: Payer: BC Managed Care – PPO | Admitting: Emergency Medicine

## 2021-07-19 DIAGNOSIS — L989 Disorder of the skin and subcutaneous tissue, unspecified: Secondary | ICD-10-CM | POA: Diagnosis not present

## 2021-07-19 MED ORDER — MUPIROCIN 2 % EX OINT
1.0000 | TOPICAL_OINTMENT | Freq: Two times a day (BID) | CUTANEOUS | 0 refills | Status: DC
Start: 2021-07-19 — End: 2023-02-18

## 2021-07-19 NOTE — Progress Notes (Signed)
Virtual Visit Consent   Teresa Rose, you are scheduled for a virtual visit with a  provider today.     Just as with appointments in the office, your consent must be obtained to participate.  Your consent will be active for this visit and any virtual visit you may have with one of our providers in the next 365 days.     If you have a MyChart account, a copy of this consent can be sent to you electronically.  All virtual visits are billed to your insurance company just like a traditional visit in the office.    As this is a virtual visit, video technology does not allow for your provider to perform a traditional examination.  This may limit your provider's ability to fully assess your condition.  If your provider identifies any concerns that need to be evaluated in person or the need to arrange testing (such as labs, EKG, etc.), we will make arrangements to do so.     Although advances in technology are sophisticated, we cannot ensure that it will always work on either your end or our end.  If the connection with a video visit is poor, the visit may have to be switched to a telephone visit.  With either a video or telephone visit, we are not always able to ensure that we have a secure connection.     I need to obtain your verbal consent now.   Are you willing to proceed with your visit today?    Teresa Rose has provided verbal consent on 07/19/2021 for a virtual visit (video or telephone).   Rennis Harding, [place]   Date: 07/19/2021 1:31 PM   Virtual Visit via Video Note   I, Rennis Harding, connected with  Teresa Rose  ([id number], [birth year]) on 07/19/21 at  1:15 PM EDT by a video-enabled telemedicine application and verified that I am speaking with the correct person using two identifiers.  Location: Patient: Virtual Visit Location Patient: Mobile Provider: Virtual Visit Location Provider: Home   I discussed the limitations of evaluation and management by telemedicine and the  availability of in person appointments. The patient expressed understanding and agreed to proceed.    History of Present Illness: Teresa Rose is a 35 y.o. who identifies as a female who was assigned female at birth, and is being seen today for bump on LT palm since July 20th of this year.  Denies a precipitating event or trauma.  Describes as red, and slightly raised. Denies pain or itching.  Has not tried OTC treatment.  Denies aggravating factors.  States overall it has improved.  Does not have a PCP.  Has not been seen for this problem before. Denies previous symptoms.  Denies fever, chills, nausea, vomiting, numbness/ tingling.    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  Resting comfortably at home.  Head is normocephalic, atraumatic.  No labored breathing.  Speech is clear and coherent with logical content.  Patient is alert and oriented at baseline.  Skin: red area to LT palm over thenar prominence, no obvious drainage or bleeding seen on video (picture attached below)    Assessment and Plan: 1. Hand lesion I am not sure what the bump on your hand is at this time.  However, I will cover for possible infection given that it is red and appears inflammed Prescribed bactroban ointment use as directed and to completion Please follow up with PCP or go to urgent care for further evaluation and management if symptoms do not improve with  medications Return or go to the ED if you have any new or worsening symptoms such as pain, increased redness, swelling, discharge, high fever, night sweats, drainage, bleeding, etc...   Follow Up Instructions: I discussed the assessment and treatment plan with the patient. The patient was provided an opportunity to ask questions and all were answered. The patient agreed with the plan and demonstrated an understanding of the instructions.  A copy of instructions were sent to the patient via MyChart unless otherwise noted below.    The patient was advised to call back or seek an in-person evaluation if the symptoms worsen or if the condition fails to improve as anticipated.  Time:  I spent 10 minutes with the patient via telehealth technology discussing the above problems/concerns.    Rennis Harding, PA-C

## 2021-07-19 NOTE — Patient Instructions (Signed)
  Heath Shadduck, thank you for joining [place], PA-C for today's virtual visit.  While this provider is not your primary care provider (PCP), if your PCP is located in our provider database this encounter information will be shared with them immediately following your visit.  Consent: (Patient) Teresa Rose provided verbal consent for this virtual visit at the beginning of the encounter.  Current Medications: No current outpatient medications on file.   Medications ordered in this encounter:  No orders of the defined types were placed in this encounter.    *If you need refills on other medications prior to your next appointment, please contact your pharmacy*  Follow-Up: Call back or seek an in-person evaluation if the symptoms worsen or if the condition fails to improve as anticipated.  Other Instructions I am not sure what the bump on your hand is at this time.  However, I will cover for possible infection given that it is red and appears inflammed Prescribed bactroban ointment use as directed and to completion Please follow up with PCP or go to urgent care for further evaluation and management if symptoms do not improve with medications Return or go to the ED if you have any new or worsening symptoms such as pain, increased redness, swelling, discharge, high fever, night sweats, drainage, bleeding, etc...    If you have been instructed to have an in-person evaluation today at a local Urgent Care facility, please use the link below. It will take you to a list of all of our available [facility], including address, phone number and hours of operation. Please do not delay care.  [facility]  If you or a family member do not have a primary care provider, use the link below to schedule a visit and establish care. When you choose a [facility] primary care physician or advanced practice provider, you gain a long-term partner in health. Find a Primary Care  Provider  Learn more about [facility]'s in-office and virtual care options: [place] - Get Care Now

## 2023-02-06 ENCOUNTER — Telehealth: Payer: BC Managed Care – PPO | Admitting: Family Medicine

## 2023-02-06 ENCOUNTER — Encounter (HOSPITAL_COMMUNITY): Payer: Self-pay

## 2023-02-06 ENCOUNTER — Ambulatory Visit (HOSPITAL_COMMUNITY)
Admission: EM | Admit: 2023-02-06 | Discharge: 2023-02-06 | Disposition: A | Discharge status: Home / Self Care | Payer: BC Managed Care – PPO | Attending: Nurse Practitioner | Admitting: Nurse Practitioner

## 2023-02-06 DIAGNOSIS — I10 Essential (primary) hypertension: Secondary | ICD-10-CM | POA: Diagnosis not present

## 2023-02-06 DIAGNOSIS — R519 Headache, unspecified: Secondary | ICD-10-CM | POA: Diagnosis not present

## 2023-02-06 DIAGNOSIS — R030 Elevated blood-pressure reading, without diagnosis of hypertension: Secondary | ICD-10-CM

## 2023-02-06 MED ORDER — KETOROLAC TROMETHAMINE 60 MG/2ML IM SOLN
INTRAMUSCULAR | Status: AC
  Filled 2023-02-06: qty 2

## 2023-02-06 MED ORDER — KETOROLAC TROMETHAMINE 60 MG/2ML IM SOLN
60.0000 mg | Freq: Once | INTRAMUSCULAR | Status: AC
  Administered 2023-02-06: 60 mg via INTRAMUSCULAR

## 2023-02-06 MED ORDER — KETOROLAC TROMETHAMINE 30 MG/ML IJ SOLN
INTRAMUSCULAR | Status: AC
  Filled 2023-02-06: qty 1

## 2023-02-06 MED ORDER — NIFEDIPINE ER OSMOTIC RELEASE 30 MG PO TB24: 30.0000 mg | ORAL_TABLET | Freq: Every day | ORAL | 0 refills | Status: DC

## 2023-02-06 NOTE — Progress Notes (Signed)
BP 181/108- starting to have symptoms. Advised to be seen in person now. She has history of pregnancy induced HTN in 2022- do not feel this is related. But might need low dose HTCZ or alike to keep pressure controlled.  Patient acknowledged agreement and understanding of the plan.

## 2023-02-06 NOTE — ED Triage Notes (Signed)
Pt presents with c/o hypertension. States she was sent home from work by her boss to get checked.   Pt states she has a headache denies other sxs.

## 2023-02-06 NOTE — Discharge Instructions (Addendum)
Managing hypertension is very important. Over time, hypertension can damage the arteries and decrease blood flow to parts of the body, including the brain, heart, and kidneys. Having untreated or uncontrolled hypertension can lead to heart attack, stroke, weakened blood vessels (aneurysm), heart failure, kidney damage, eye damage, memory/concentration problems and vascular dementia. Please monitor your blood pressure closely and take your medications as prescribed. Go to the ED immediately if you develop a severe headache, dizziness, sudden vision problems, confusion, experience unusual weakness or numbness, severe pain in your chest or abdomen, you vomit repeatedly or have trouble breathing.

## 2023-02-06 NOTE — ED Provider Notes (Signed)
[facility]    CSN: [id number] Arrival date & time: 02/06/23  1404      History   Chief Complaint Chief Complaint  Patient presents with   Hypertension    HPI Teresa Rose is a 37 y.o. female.   History of Present Illness  Teresa Rose is a 37 y.o. female that presents for evaluation of headache and elevated BP measurement.  Patient reports that she had a "stressful day" at work a couple of days ago.  Later that evening, she was not feeling well.  She began to have a headache and mild visual changes.  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  It was noted to be elevated at 160/108 and 181/108 today.  Patient had a virtual visit with a nurse practitioner today and was told that she needed to be seen in person due to such an elevated blood pressure reading.  Patient reports that she has a history of postpartum hypertension.  The day after she delivered her son back in August 2022 she started to have elevated blood pressures and required medication for hypertension for a little under 6 weeks postpartum.  At her 6-week postpartum appointment, her blood pressure had stabilized and she was discontinued from the medication.  She reports that she has not had any issues with her blood pressure up until 2 days ago.  Patient denies any chest pain, dyspnea, neck aches, orthopnea, palpitations, paroxysmal nocturnal dyspnea, peripheral edema, pulsating in the ears, light sensitivity, tiredness/fatigue or facial/limb paresthesias.  Patient smokes cigarettes and has a family history of hypertension.         Past Medical History:  Diagnosis Date   Frequent headaches     Patient Active Problem List   Diagnosis Date Noted   Normal postpartum course 05/18/2021   Acute blood loss anemia 05/18/2021   RhD negative 05/16/2021   Alpha thalassemia silent carrier 05/16/2021   Herpes infection during pregnancy in third trimester 05/16/2021    S/P cesarean section 05/16/2021    Past Surgical History:  Procedure Laterality Date   arm surgery Right    CESAREAN SECTION N/A 05/16/2021   Procedure: CESAREAN SECTION;  Surgeon: Osborn Coho, MD;  Location: [facility];  Service: Obstetrics;  Laterality: N/A;   EXTERNAL EAR SURGERY      OB History     Gravida  1   Para  1   Term  1   Preterm      AB      Living  1      SAB      IAB      Ectopic      Multiple  0   Live Births  1            Home Medications    Prior to Admission medications   Medication Sig Start Date End Date Taking? Authorizing Provider  NIFEdipine (PROCARDIA XL) 30 MG 24 hr tablet Take 1 tablet (30 mg total) by mouth daily. 02/06/23 03/08/23 Yes Lurline Idol, FNP  mupirocin ointment (BACTROBAN) 2 % Apply 1 application topically 2 (two) times daily. 07/19/21   Rennis Harding, PA-C    Family History Family History  Problem Relation Age of Onset   Hypertension Mother    Heart disease Mother 58       MI   Hypertension Maternal Aunt    Heart disease Maternal Aunt    Hypertension Maternal Uncle  Heart disease Maternal Uncle    Hypertension Paternal Grandfather    Stroke Neg Hx     Social History Social History   Tobacco Use   Smoking status: Every Day    Packs/day: .25    Types: Cigarettes   Smokeless tobacco: Never  Substance Use Topics   Alcohol use: No    Comment: social   Drug use: No     Allergies   Patient has no known allergies.   Review of Systems Review of Systems  Constitutional:  Negative for diaphoresis.  Eyes:  Positive for visual disturbance.  Respiratory:  Negative for cough and shortness of breath.   Cardiovascular:  Negative for chest pain, palpitations and leg swelling.  Gastrointestinal:  Negative for nausea and vomiting.  Neurological:  Positive for headaches. Negative for dizziness, weakness, light-headedness and numbness.  All other systems reviewed and are negative.    Physical  Exam Triage Vital Signs ED Triage Vitals  Enc Vitals Group     BP 02/06/23 1432 (!) 158/95     Pulse Rate 02/06/23 1431 69     Resp 02/06/23 1431 18     Temp 02/06/23 1431 98.4 F (36.9 C)     Temp Source 02/06/23 1431 Oral     SpO2 02/06/23 1431 99 %     Weight --      Height --      Head Circumference --      Peak Flow --      Pain Score 02/06/23 1430 3     Pain Loc --      Pain Edu? --      Excl. in GC? --    No data found.  Updated Vital Signs BP (!) 158/95   Pulse 69   Temp 98.4 F (36.9 C) (Oral)   Resp 18   LMP 01/26/2023 (Exact Date)   SpO2 99%   Visual Acuity Right Eye Distance:   Left Eye Distance:   Bilateral Distance:    Right Eye Near:   Left Eye Near:    Bilateral Near:     Physical Exam Vitals reviewed.  Constitutional:      General: She is not in acute distress.    Appearance: Normal appearance. She is not ill-appearing, toxic-appearing or diaphoretic.  HENT:     Head: Normocephalic.  Eyes:     Extraocular Movements: Extraocular movements intact.     Conjunctiva/sclera: Conjunctivae normal.     Pupils: Pupils are equal, round, and reactive to light.  Cardiovascular:     Rate and Rhythm: Normal rate and regular rhythm.     Pulses: Normal pulses.     Heart sounds: Normal heart sounds.  Pulmonary:     Effort: Pulmonary effort is normal.     Breath sounds: Normal breath sounds.  Musculoskeletal:        General: No swelling. Normal range of motion.     Cervical back: Normal range of motion and neck supple.  Skin:    General: Skin is warm and dry.  Neurological:     General: No focal deficit present.     Mental Status: She is alert and oriented to person, place, and time.      UC Treatments / Results  Labs (all labs ordered are listed, but only abnormal results are displayed) Labs Reviewed - No data to display  EKG   Radiology No results found.  Procedures Procedures (including critical care time)  Medications Ordered in  UC Medications  ketorolac (TORADOL) injection 60 mg (60 mg Intramuscular Given 02/06/23 1509)    Initial Impression / Assessment and Plan / UC Course  I have reviewed the triage vital signs and the nursing notes.  Pertinent labs & imaging results that were available during my care of the patient were reviewed by me and considered in my medical decision making (see chart for details).    37 year old female presenting for evaluation of headache, visual disturbances and elevated blood pressure.  Patient has a history of postpartum hypertension back in August 2022 and required medication management for little under 6 weeks postpartum but has not had any issues with blood pressure since.  Currently, patient still has a mild headache.  Vision is back to normal.  Blood pressure in clinic is 158/95.  All other vitals unremarkable.  Physical exam as above.  There is no indication of hypertensive urgency or emergency at this time.  Patient does not have a primary care provider.  Referred to Dr. Velna Hatchet as a new patient.  Patient is to call in the morning to arrange follow-up for as soon as possible.  In the meantime, patient will be prescribed Procardia 30 mg XL daily.  This medication is what she took back in 2022 and tolerated it without any difficulty.  Advised against ACE inhibitors.  Smoking cessation discussed as well as heart healthy diet, exercise and drinking plenty of water. ED precautions discussed.  Patient should have blood pressure monitored daily and whenever she experiences any symptoms.  She can have the school nurse continue to monitor her blood pressure. Patient also encouraged to purchase her own blood pressure cuff for home. She was advised to record her blood pressure readings and take to her PCP appointment.    Today's evaluation has revealed no signs of a dangerous process. Discussed diagnosis with patient and/or guardian. Patient and/or guardian aware of their diagnosis, possible red  flag symptoms to watch out for and need for close follow up. Patient and/or guardian understands verbal and written discharge instructions. Patient and/or guardian comfortable with plan and disposition.  Patient and/or guardian has a clear mental status at this time, good insight into illness (after discussion and teaching) and has clear judgment to make decisions regarding their care  Documentation was completed with the aid of voice recognition software. Transcription may contain typographical errors. Final Clinical Impressions(s) / UC Diagnoses   Final diagnoses:  Acute nonintractable headache, unspecified headache type  Elevated blood pressure reading with diagnosis of hypertension     Discharge Instructions      Managing hypertension is very important. Over time, hypertension can damage the arteries and decrease blood flow to parts of the body, including the brain, heart, and kidneys. Having untreated or uncontrolled hypertension can lead to heart attack, stroke, weakened blood vessels (aneurysm), heart failure, kidney damage, eye damage, memory/concentration problems and vascular dementia. Please monitor your blood pressure closely and take your medications as prescribed. Go to the ED immediately if you develop a severe headache, dizziness, sudden vision problems, confusion, experience unusual weakness or numbness, severe pain in your chest or abdomen, you vomit repeatedly or have trouble breathing.       ED Prescriptions     Medication Sig Dispense Auth. Provider   NIFEdipine (PROCARDIA XL) 30 MG 24 hr tablet Take 1 tablet (30 mg total) by mouth daily. 30 tablet Lurline Idol, FNP      PDMP not reviewed this encounter.   Lurline Idol, [place] 02/06/23 [phone]

## 2023-02-13 ENCOUNTER — Encounter (HOSPITAL_COMMUNITY): Payer: Self-pay

## 2023-02-18 ENCOUNTER — Encounter: Payer: Self-pay | Admitting: Family Medicine

## 2023-02-18 ENCOUNTER — Ambulatory Visit (INDEPENDENT_AMBULATORY_CARE_PROVIDER_SITE_OTHER): Payer: BC Managed Care – PPO | Admitting: Family Medicine

## 2023-02-18 VITALS — BP 136/81 | HR 79 | Temp 98.4°F | Resp 18 | Ht 68.0 in | Wt 207.2 lb

## 2023-02-18 DIAGNOSIS — Z7689 Persons encountering health services in other specified circumstances: Secondary | ICD-10-CM | POA: Diagnosis not present

## 2023-02-18 DIAGNOSIS — I10 Essential (primary) hypertension: Secondary | ICD-10-CM

## 2023-02-18 DIAGNOSIS — E559 Vitamin D deficiency, unspecified: Secondary | ICD-10-CM

## 2023-02-18 HISTORY — DX: Essential (primary) hypertension: I10

## 2023-02-18 HISTORY — DX: Vitamin D deficiency, unspecified: E55.9

## 2023-02-18 MED ORDER — NIFEDIPINE ER 60 MG PO TB24: 60.0000 mg | ORAL_TABLET | Freq: Every day | ORAL | 1 refills | Status: DC

## 2023-02-18 NOTE — Progress Notes (Signed)
New Patient Office Visit  Subjective    Patient ID: Teresa Rose, female    DOB: Apr 25, 1986  Age: 37 y.o. MRN: [id number]  CC:  Chief Complaint  Patient presents with   Establish Care   Hypertension    Patient states that she has had BP issues in the past after pregnancy , she states /that within the last few weeks her BP was elevated her readings were , 160/101, and 181/101, She was seen at [facility] health urgent care and was prescibed Procardia XL 30 mg, She states that her reading are good some days and high others     HPI Teresa Rose presents to establish care. Pt is new to me.  Pt reports 2 weeks ago, she had elevated blood pressure since pregnancy in 2022. She reports this occurred after her pregnancy and giving birth. She was given blood pressure medicine to take for a few weeks postpartum. It was normal and so this was stopped. She reports feeling like she had headaches, fatigue, and didn't feel like herself. 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 It was elevated at that time. She returned to work and the school nurse rechecked it. Readings reported includes 180/101. She was sent home and went to urgent care. She was given 30 days of Nifedipine 30 mg daily. She reports yesterday was 145/92 at 10 am. She does report headaches 3/10 since taking the medicine. Not lasting all day and not everyday.  She denies sinus congestion or nasal congestion currently.  She also reports since starting this medicine on 4/25, she's had to take 2.   Outpatient Encounter Medications as of 02/18/2023  Medication Sig   NIFEdipine (PROCARDIA XL) 30 MG 24 hr tablet Take 1 tablet (30 mg total) by mouth daily.   mupirocin ointment (BACTROBAN) 2 % Apply 1 application topically 2 (two) times daily. (Patient not taking: Reported on 02/18/2023)   No facility-administered encounter medications on file as of 02/18/2023.    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 Dx by AR 2016--no recent/current issues.   Vitamin D deficiency 02/18/2023   16.9 at [facility], recommend supplementation    Past Surgical History:  Procedure Laterality Date   arm surgery Right    CESAREAN SECTION N/A 05/16/2021   Procedure: CESAREAN SECTION;  Surgeon: Osborn Coho, MD;  Location: [facility];  Service: Obstetrics;  Laterality: N/A;   EXTERNAL EAR SURGERY      Family History  Problem Relation Age of Onset   Hypertension Mother    Heart disease Mother 8       MI   Hypertension Maternal Aunt    Heart disease Maternal Aunt    Hypertension Maternal Uncle    Heart disease Maternal Uncle    Hypertension Paternal Grandfather    Stroke Neg Hx     Social History   Socioeconomic History   Marital status: Single    Spouse name: Not on file   Number of children: Not on file   Years of education: Not on file   Highest education level: Not on file  Occupational History   Not on file  Tobacco Use   Smoking status: Every Day    Types: Cigars    Passive exposure: Current  Smokeless tobacco: Never  Vaping Use   Vaping Use: Never used  Substance and Sexual Activity   Alcohol use: Yes    Comment: socially   Drug use: No   Sexual activity: Yes    Birth control/protection: None  Other Topics Concern   Not on file  Social History Narrative   Not on file   Social Determinants of Health   Financial Resource Strain: Not on file  Food Insecurity: Not on file  Transportation Needs: Not on file  Physical Activity: Not on file  Stress: Not on file  Social Connections: Not on file  Intimate Partner Violence: Not on file    Review of Systems  Neurological:  Positive for headaches.  All other systems reviewed and are negative.      Objective    BP 136/81   Pulse 79   Temp 98.4 F  (36.9 C) (Oral)   Resp 18   Ht 5\' 8"  (1.727 m)   Wt 207 lb 3.2 oz (94 kg)   LMP 01/26/2023 (Exact Date)   SpO2 100%   BMI 31.50 kg/m   Physical Exam Vitals and nursing note reviewed.  Constitutional:      Appearance: Normal appearance. She is normal weight.  HENT:     Head: Normocephalic and atraumatic.     Right Ear: External ear normal.     Left Ear: External ear normal.     Nose: Nose normal.     Mouth/Throat:     Mouth: Mucous membranes are moist.     Pharynx: Oropharynx is clear.  Eyes:     Conjunctiva/sclera: Conjunctivae normal.     Pupils: Pupils are equal, round, and reactive to light.  Cardiovascular:     Rate and Rhythm: Normal rate and regular rhythm.     Pulses: Normal pulses.     Heart sounds: Normal heart sounds.  Pulmonary:     Effort: Pulmonary effort is normal.     Breath sounds: Normal breath sounds.  Abdominal:     General: Abdomen is flat. Bowel sounds are normal.  Skin:    Capillary Refill: Capillary refill takes less than 2 seconds.  Neurological:     General: No focal deficit present.     Mental Status: She is alert and oriented to person, place, and time. Mental status is at baseline.  Psychiatric:        Mood and Affect: Mood normal.        Behavior: Behavior normal.        Thought Content: Thought content normal.        Judgment: Judgment normal.       Assessment & Plan:   Encounter to establish care with new doctor  Primary hypertension -     NIFEdipine ER; Take 1 tablet (60 mg total) by mouth daily.  Dispense: 30 tablet; Refill: 1   To increase Nifedipine to 60mg  instead of taking 30mg  BID. To monitor blood pressures. If headaches still persists, may be medication induced. If this happens, pt will reach out to me via mychart to let me know and will switch to a different medicine. Will see back in 4-6 weeks for complete physical with fasting labs.    No follow-ups on file.   Suzan Slick, MD

## 2023-04-01 ENCOUNTER — Ambulatory Visit (INDEPENDENT_AMBULATORY_CARE_PROVIDER_SITE_OTHER): Payer: BC Managed Care – PPO | Admitting: Family Medicine

## 2023-04-01 ENCOUNTER — Encounter: Payer: Self-pay | Admitting: Family Medicine

## 2023-04-01 VITALS — BP 125/85 | HR 83 | Temp 98.4°F | Resp 18 | Ht 68.0 in | Wt 205.4 lb

## 2023-04-01 DIAGNOSIS — Z13220 Encounter for screening for lipoid disorders: Secondary | ICD-10-CM

## 2023-04-01 DIAGNOSIS — I10 Essential (primary) hypertension: Secondary | ICD-10-CM | POA: Diagnosis not present

## 2023-04-01 DIAGNOSIS — R5382 Chronic fatigue, unspecified: Secondary | ICD-10-CM

## 2023-04-01 DIAGNOSIS — R7302 Impaired glucose tolerance (oral): Secondary | ICD-10-CM | POA: Diagnosis not present

## 2023-04-01 DIAGNOSIS — Z0000 Encounter for general adult medical examination without abnormal findings: Secondary | ICD-10-CM | POA: Diagnosis not present

## 2023-04-01 MED ORDER — NIFEDIPINE ER 60 MG PO TB24: 60.0000 mg | ORAL_TABLET | Freq: Every day | ORAL | 1 refills | Status: DC

## 2023-04-01 NOTE — Progress Notes (Signed)
Complete physical exam  Patient: Teresa Rose   DOB: 1986/02/27   37 y.o. Female  MRN: [id number]  Subjective:    Chief Complaint  Patient presents with   Annual Exam    Patient is fasting for labwork    Teresa Rose is a 37 y.o. female who presents today for a complete physical exam. She reports consuming a general diet.  walking  She generally feels well. She reports sleeping well. She does not have additional problems to discuss today.    Most recent fall risk assessment:    02/18/2023   10:08 AM  Fall Risk   Falls in the past year? 0  Number falls in past yr: 0  Injury with Fall? 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 Dx by AR 2016--no recent/current issues.   Vitamin D deficiency 02/18/2023   16.9 at [facility], recommend supplementation   Past Surgical History:  Procedure Laterality Date   arm surgery Right    CESAREAN SECTION N/A 05/16/2021   Procedure: CESAREAN SECTION;  Surgeon: Osborn Coho, MD;  Location: [facility];  Service: Obstetrics;  Laterality: N/A;   EXTERNAL EAR SURGERY     Social History   Socioeconomic History   Marital status: Single    Spouse name: Not on file   Number of children: 1   Years of  education: Not on file   Highest education level: Not on file  Occupational History   Not on file  Tobacco Use   Smoking status: Every Day    Types: Cigars    Passive exposure: Current   Smokeless tobacco: Never  Vaping Use   Vaping Use: Never used  Substance and Sexual Activity   Alcohol use: Yes    Comment: socially   Drug use: No   Sexual activity: Yes    Birth control/protection: None  Other Topics Concern   Not on file  Social History Narrative   Dad and brother [employer]   Pt and stepmother [employer]   Social Determinants of Health   Financial Resource Strain: Not on file  Food Insecurity: Not on file  Transportation Needs: Not on file  Physical Activity: Not on file  Stress: Not on file  Social Connections: Not on file  Intimate Partner Violence: Not on file   Family History  Problem Relation Age of Onset   Hypertension Mother    Heart disease Mother 67       MI   Hypertension Maternal Aunt    Heart disease Maternal Aunt    Hypertension Maternal Uncle    Heart disease Maternal Uncle    Hypertension Paternal Actor  Stroke Neg Hx    No Known Allergies    Patient Care Team: Suzan Slick, MD as PCP - General (Family Medicine)   Outpatient Medications Prior to Visit  Medication Sig   [DISCONTINUED] NIFEdipine (ADALAT CC) 60 MG 24 hr tablet Take 1 tablet (60 mg total) by mouth daily.   No facility-administered medications prior to visit.    Review of Systems  All other systems reviewed and are negative.        Objective:     BP 125/85   Pulse 83   Temp 98.4 F (36.9 C) (Oral)   Resp 18   Ht 5\' 8"  (1.727 m)   Wt 205 lb 6.4 oz (93.2 kg)   LMP 03/07/2023   SpO2 100%   BMI 31.23 kg/m  BP Readings from Last 3 Encounters:  04/01/23 125/85  02/18/23 136/81  02/06/23 (!) 158/95      Physical Exam Vitals and nursing note reviewed.  Constitutional:      Appearance: Normal appearance. She is normal weight.  HENT:     Head:  Normocephalic and atraumatic.     Right Ear: Tympanic membrane, ear canal and external ear normal.     Left Ear: Tympanic membrane and external ear normal.     Nose: Nose normal.     Mouth/Throat:     Mouth: Mucous membranes are moist.     Pharynx: Oropharynx is clear.  Eyes:     Conjunctiva/sclera: Conjunctivae normal.     Pupils: Pupils are equal, round, and reactive to light.  Cardiovascular:     Rate and Rhythm: Normal rate and regular rhythm.     Pulses: Normal pulses.     Heart sounds: Normal heart sounds.  Pulmonary:     Effort: Pulmonary effort is normal.     Breath sounds: Normal breath sounds.  Abdominal:     General: Abdomen is flat. Bowel sounds are normal.  Skin:    General: Skin is warm.     Capillary Refill: Capillary refill takes less than 2 seconds.  Neurological:     General: No focal deficit present.     Mental Status: She is alert and oriented to person, place, and time. Mental status is at baseline.  Psychiatric:        Mood and Affect: Mood normal.        Behavior: Behavior normal.        Thought Content: Thought content normal.        Judgment: Judgment normal.     No results found for any visits on 04/01/23.     Assessment & Plan:    Routine Health Maintenance and Physical Exam  Immunization History  Administered Date(s) Administered   PFIZER(Purple Top)SARS-COV-2 Vaccination 12/18/2019, 01/08/2020, 11/14/2020   Tdap 03/01/2021    Health Maintenance  Topic Date Due   COVID-19 Vaccine (4 - 2023-24 season) 06/14/2022   INFLUENZA VACCINE  05/15/2023   PAP SMEAR-Modifier  10/19/2023   DTaP/Tdap/Td (2 - Td or Tdap) 03/02/2031   Hepatitis C Screening  Completed   HIV Screening  Completed   HPV VACCINES  Aged Out    Discussed health benefits of physical activity, and encouraged her to engage in regular exercise appropriate for her age and condition.  Problem List Items Addressed This Visit       Cardiovascular and Mediastinum   HTN  (hypertension)   Relevant Medications   NIFEdipine (ADALAT CC) 60 MG 24 hr tablet   Other Visit Diagnoses  Annual physical exam    -  Primary   Encounter for lipid screening for cardiovascular disease       Relevant Orders   Lipid panel   Impaired glucose tolerance       Relevant Orders   CBC with Differential/Platelet   Comprehensive metabolic panel   Hemoglobin A1c     Annual physical exam  Encounter for lipid screening for cardiovascular disease -     Lipid panel  Impaired glucose tolerance -     CBC with Differential/Platelet -     Comprehensive metabolic panel -     Hemoglobin A1c  Primary hypertension -     NIFEdipine ER; Take 1 tablet (60 mg total) by mouth daily.  Dispense: 90 tablet; Refill: 1  Chronic fatigue -     VITAMIN D 25 Hydroxy (Vit-D Deficiency, Fractures)   Screening labs Pt reports fatigue so adding vitamin D and per pt's request.  Refilled Nifedipine 60 mg daily for HTN and to continue regimen.  No follow-ups on file.     Suzan Slick, MD

## 2023-04-02 ENCOUNTER — Other Ambulatory Visit: Payer: Self-pay | Admitting: Family Medicine

## 2023-04-02 DIAGNOSIS — E559 Vitamin D deficiency, unspecified: Secondary | ICD-10-CM

## 2023-04-02 LAB — CBC WITH DIFFERENTIAL/PLATELET
Basophils Absolute: 0 10*3/uL (ref 0.0–0.2)
Basos: 1 %
EOS (ABSOLUTE): 0.3 10*3/uL (ref 0.0–0.4)
Eos: 3 %
Hematocrit: 42.6 % (ref 34.0–46.6)
Hemoglobin: 13 g/dL (ref 11.1–15.9)
Immature Grans (Abs): 0 10*3/uL (ref 0.0–0.1)
Immature Granulocytes: 0 %
Lymphocytes Absolute: 3.6 10*3/uL — ABNORMAL HIGH (ref 0.7–3.1)
Lymphs: 44 %
MCH: 24.7 pg — ABNORMAL LOW (ref 26.6–33.0)
MCHC: 30.5 g/dL — ABNORMAL LOW (ref 31.5–35.7)
MCV: 81 fL (ref 79–97)
Monocytes Absolute: 0.5 10*3/uL (ref 0.1–0.9)
Monocytes: 6 %
Neutrophils Absolute: 3.8 10*3/uL (ref 1.4–7.0)
Neutrophils: 46 %
Platelets: 270 10*3/uL (ref 150–450)
RBC: 5.26 x10E6/uL (ref 3.77–5.28)
RDW: 14.6 % (ref 11.7–15.4)
WBC: 8.2 10*3/uL (ref 3.4–10.8)

## 2023-04-02 LAB — COMPREHENSIVE METABOLIC PANEL
ALT: 25 IU/L (ref 0–32)
AST: 18 IU/L (ref 0–40)
Albumin: 4.4 g/dL (ref 3.9–4.9)
Alkaline Phosphatase: 106 IU/L (ref 44–121)
BUN/Creatinine Ratio: 13 (ref 9–23)
BUN: 9 mg/dL (ref 6–20)
Bilirubin Total: 0.3 mg/dL (ref 0.0–1.2)
CO2: 22 mmol/L (ref 20–29)
Calcium: 9.8 mg/dL (ref 8.7–10.2)
Chloride: 103 mmol/L (ref 96–106)
Creatinine, Ser: 0.72 mg/dL (ref 0.57–1.00)
Globulin, Total: 2.6 g/dL (ref 1.5–4.5)
Glucose: 91 mg/dL (ref 70–99)
Potassium: 3.8 mmol/L (ref 3.5–5.2)
Sodium: 139 mmol/L (ref 134–144)
Total Protein: 7 g/dL (ref 6.0–8.5)
eGFR: 111 mL/min/{1.73_m2} (ref >59)

## 2023-04-02 LAB — HEMOGLOBIN A1C
Est. average glucose Bld gHb Est-mCnc: 114 mg/dL
Hgb A1c MFr Bld: 5.6 % (ref 4.8–5.6)

## 2023-04-02 LAB — LIPID PANEL
Chol/HDL Ratio: 5.6 ratio — ABNORMAL HIGH (ref 0.0–4.4)
Cholesterol, Total: 229 mg/dL — ABNORMAL HIGH (ref 100–199)
HDL: 41 mg/dL (ref >39)
LDL Chol Calc (NIH): 174 mg/dL — ABNORMAL HIGH (ref 0–99)
Triglycerides: 78 mg/dL (ref 0–149)
VLDL Cholesterol Cal: 14 mg/dL (ref 5–40)

## 2023-04-02 LAB — VITAMIN D 25 HYDROXY (VIT D DEFICIENCY, FRACTURES): Vit D, 25-Hydroxy: 15.8 ng/mL — ABNORMAL LOW (ref 30.0–100.0)

## 2023-04-02 MED ORDER — VITAMIN D (ERGOCALCIFEROL) 1.25 MG (50000 UNIT) PO CAPS: 50000.0000 [IU] | ORAL_CAPSULE | ORAL | 0 refills | Status: DC

## 2023-05-01 ENCOUNTER — Encounter: Payer: Self-pay | Admitting: Family Medicine

## 2023-05-06 ENCOUNTER — Ambulatory Visit: Payer: BC Managed Care – PPO | Admitting: Family Medicine

## 2023-05-08 ENCOUNTER — Ambulatory Visit: Payer: BC Managed Care – PPO | Admitting: Family Medicine

## 2023-05-12 ENCOUNTER — Ambulatory Visit (INDEPENDENT_AMBULATORY_CARE_PROVIDER_SITE_OTHER): Payer: BC Managed Care – PPO | Admitting: Family Medicine

## 2023-05-12 VITALS — BP 100/66 | HR 67 | Temp 98.6°F | Resp 20 | Ht 68.0 in | Wt 211.1 lb

## 2023-05-12 DIAGNOSIS — Z111 Encounter for screening for respiratory tuberculosis: Secondary | ICD-10-CM

## 2023-05-12 NOTE — Progress Notes (Signed)
   Subjective:    Patient ID: Teresa Rose, female    DOB: 1986/10/13, 37 y.o.   MRN: [id number]  HPI Patient is here for PPD placement   Review of Systems     Objective:   Physical Exam        Assessment & Plan:   Patient tolerated injection well without any complications

## 2023-05-14 ENCOUNTER — Ambulatory Visit (INDEPENDENT_AMBULATORY_CARE_PROVIDER_SITE_OTHER): Payer: BC Managed Care – PPO | Admitting: Family Medicine

## 2023-05-14 VITALS — BP 116/83 | HR 62 | Temp 98.3°F | Resp 18 | Ht 68.0 in | Wt 208.0 lb

## 2023-05-14 DIAGNOSIS — Z111 Encounter for screening for respiratory tuberculosis: Secondary | ICD-10-CM

## 2023-05-14 NOTE — Progress Notes (Signed)
   Subjective:    Patient ID: Teresa Rose, female    DOB: 04/08/1986, 37 y.o.   MRN: [id number]  HPI Patient is here for  PPD read   Review of Systems     Objective:   Physical Exam        Assessment & Plan:   Results were negative

## 2023-10-20 ENCOUNTER — Other Ambulatory Visit: Payer: Self-pay | Admitting: Family Medicine

## 2023-10-20 DIAGNOSIS — I10 Essential (primary) hypertension: Secondary | ICD-10-CM

## 2024-04-01 ENCOUNTER — Encounter: Payer: BC Managed Care – PPO | Admitting: Family Medicine

## 2024-04-13 ENCOUNTER — Ambulatory Visit (INDEPENDENT_AMBULATORY_CARE_PROVIDER_SITE_OTHER): Admitting: Family Medicine

## 2024-04-13 VITALS — BP 121/83 | HR 76 | Temp 98.9°F | Resp 18 | Ht 68.0 in | Wt 201.0 lb

## 2024-04-13 DIAGNOSIS — Z13220 Encounter for screening for lipoid disorders: Secondary | ICD-10-CM | POA: Diagnosis not present

## 2024-04-13 DIAGNOSIS — Z0000 Encounter for general adult medical examination without abnormal findings: Secondary | ICD-10-CM | POA: Diagnosis not present

## 2024-04-13 DIAGNOSIS — Z136 Encounter for screening for cardiovascular disorders: Secondary | ICD-10-CM

## 2024-04-13 DIAGNOSIS — R7302 Impaired glucose tolerance (oral): Secondary | ICD-10-CM

## 2024-04-13 NOTE — Progress Notes (Signed)
 Complete physical exam  Patient: Teresa Rose   DOB: 1986-09-01   37 y.o. Female  MRN: [id number]  Subjective:    Chief Complaint  Patient presents with   Annual Exam    Teresa Rose is a 38 y.o. female who presents today for a complete physical exam. She reports consuming a general diet. The patient does not participate in regular exercise at present. She generally feels well. She reports sleeping well. She does not have additional problems to discuss today.    Most recent fall risk assessment:    02/18/2023   10:08 AM  Fall Risk   Falls in the past year? 0  Number falls in past yr: 0  Injury with Fall? 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 Dx by AR 2016--no recent/current issues.   Vitamin D  deficiency 02/18/2023   16.9 at [facility], recommend supplementation   Past Surgical History:  Procedure Laterality Date   arm surgery Right    CESAREAN SECTION N/A 05/16/2021   Procedure: CESAREAN SECTION;  Surgeon: Henry Slough, MD;  Location: [facility];  Service: Obstetrics;  Laterality: N/A;   EXTERNAL EAR SURGERY     FRACTURE SURGERY     Social History   Tobacco Use   Smoking status: Every Day    Types: Cigars    Passive exposure:  Current   Smokeless tobacco: Never  Vaping Use   Vaping status: Never Used  Substance Use Topics   Alcohol use: Yes    Alcohol/week: 3.0 standard drinks of alcohol    Types: 3 Glasses of wine per week    Comment: socially   Drug use: No   Family Status  Relation Name Status   Mother Jerelene Marc Alive   Father  Alive   Brother  Alive   Mat Aunt [employer] (Not Specified)   Mat Uncle Marcey Portugal (Not Specified)   PGF Maude Paterson (Not Specified)  No partnership data on file   No Known Allergies    Patient Care Team: Colette Torrence GRADE, MD as PCP - General (Family Medicine)   Outpatient Medications Prior to Visit  Medication Sig   NIFEdipine  (ADALAT  CC) 60 MG 24 hr tablet TAKE 1 TABLET BY MOUTH EVERY DAY   [DISCONTINUED] Vitamin D , Ergocalciferol , (DRISDOL ) 1.25 MG (50000 UNIT) CAPS capsule Take 1 capsule (50,000 Units total) by mouth every 7 (seven) days. Take for 8 total doses(weeks)   No facility-administered medications prior to visit.    Review of Systems  All other systems reviewed and are negative.        Objective:     BP 121/83  Pulse 76   Temp 98.9 F (37.2 C) (Oral)   Resp 18   Ht 5' 8 (1.727 m)   Wt 201 lb (91.2 kg)   LMP 03/14/2024 (Exact Date)   SpO2 100%   BMI 30.56 kg/m  BP Readings from Last 3 Encounters:  04/13/24 121/83  05/14/23 116/83  05/12/23 100/66      Physical Exam Vitals and nursing note reviewed.  Constitutional:      Appearance: Normal appearance. She is normal weight.  HENT:     Head: Normocephalic and atraumatic.     Right Ear: Tympanic membrane, ear canal and external ear normal.     Left Ear: Tympanic membrane, ear canal and external ear normal.     Nose: Nose normal.     Mouth/Throat:     Mouth: Mucous membranes are moist.     Pharynx: Oropharynx is clear.   Eyes:     Conjunctiva/sclera: Conjunctivae normal.     Pupils: Pupils are equal, round, and reactive to light.    Cardiovascular:     Rate  and Rhythm: Normal rate and regular rhythm.     Pulses: Normal pulses.     Heart sounds: Normal heart sounds.  Pulmonary:     Effort: Pulmonary effort is normal.     Breath sounds: Normal breath sounds.  Abdominal:     General: Abdomen is flat. Bowel sounds are normal.   Skin:    General: Skin is warm.     Capillary Refill: Capillary refill takes less than 2 seconds.   Neurological:     General: No focal deficit present.     Mental Status: She is alert and oriented to person, place, and time. Mental status is at baseline.   Psychiatric:        Mood and Affect: Mood normal.        Behavior: Behavior normal.        Thought Content: Thought content normal.        Judgment: Judgment normal.     No results found for any visits on 04/13/24. Last CBC Lab Results  Component Value Date   WBC 8.2 04/01/2023   HGB 13.0 04/01/2023   HCT 42.6 04/01/2023   MCV 81 04/01/2023   MCH 24.7 (L) 04/01/2023   RDW 14.6 04/01/2023   PLT 270 04/01/2023   Last metabolic panel Lab Results  Component Value Date   GLUCOSE 91 04/01/2023   NA 139 04/01/2023   K 3.8 04/01/2023   CL 103 04/01/2023   CO2 22 04/01/2023   BUN 9 04/01/2023   CREATININE 0.72 04/01/2023   EGFR 111 04/01/2023   CALCIUM 9.8 04/01/2023   PROT 7.0 04/01/2023   ALBUMIN 4.4 04/01/2023   LABGLOB 2.6 04/01/2023   BILITOT 0.3 04/01/2023   ALKPHOS 106 04/01/2023   AST 18 04/01/2023   ALT 25 04/01/2023   ANIONGAP 10 06/01/2021   Last lipids Lab Results  Component Value Date   CHOL 229 (H) 04/01/2023   HDL 41 04/01/2023   LDLCALC 174 (H) 04/01/2023   TRIG 78 04/01/2023   CHOLHDL 5.6 (H) 04/01/2023   Last hemoglobin A1c Lab Results  Component Value Date   HGBA1C 5.6 04/01/2023        Assessment & Plan:    Routine Health Maintenance and Physical Exam  Immunization History  Administered Date(s) Administered   PFIZER(Purple Top)SARS-COV-2 Vaccination 12/18/2019, 01/08/2020, 11/14/2020   PPD Test 05/12/2023    Tdap 03/01/2021    Health Maintenance  Topic Date Due   Pneumococcal Vaccine 43-76 Years old (1 of 2 - PCV) Never done   Hepatitis B Vaccines (1 of 3 - 19+ 3-dose series) Never done   HPV VACCINES (1 - 3-dose SCDM series) Never done   Cervical Cancer Screening (HPV/Pap Cotest)  Never done   COVID-19 Vaccine (4 - 2024-25 season) 06/15/2023   INFLUENZA VACCINE  05/14/2024   DTaP/Tdap/Td (2 - Td or Tdap) 03/02/2031   Hepatitis C Screening  Completed   HIV Screening  Completed   Meningococcal B Vaccine  Aged Out    Discussed health benefits of physical activity, and encouraged her to engage in regular exercise appropriate for her age and condition.  Problem List Items Addressed This Visit   None  No follow-ups on file. Annual physical exam  Encounter for lipid screening for cardiovascular disease -     Lipid panel  Impaired glucose tolerance -     CBC with Differential/Platelet -     Comprehensive metabolic panel with GFR -     Hemoglobin A1c   Screening labs See OB/GYN for pap See in 1 year sooner prn.    Torrence CINDERELLA Barrier, MD

## 2024-04-14 ENCOUNTER — Ambulatory Visit: Payer: Self-pay | Admitting: Family Medicine

## 2024-04-14 LAB — CBC WITH DIFFERENTIAL/PLATELET
Basophils Absolute: 0.1 10*3/uL (ref 0.0–0.2)
Basos: 1 %
EOS (ABSOLUTE): 0.2 10*3/uL (ref 0.0–0.4)
Eos: 3 %
Hematocrit: 40.1 % (ref 34.0–46.6)
Hemoglobin: 12.4 g/dL (ref 11.1–15.9)
Immature Grans (Abs): 0 10*3/uL (ref 0.0–0.1)
Immature Granulocytes: 0 %
Lymphocytes Absolute: 3.6 10*3/uL — ABNORMAL HIGH (ref 0.7–3.1)
Lymphs: 37 %
MCH: 26 pg — ABNORMAL LOW (ref 26.6–33.0)
MCHC: 30.9 g/dL — ABNORMAL LOW (ref 31.5–35.7)
MCV: 84 fL (ref 79–97)
Monocytes Absolute: 0.7 10*3/uL (ref 0.1–0.9)
Monocytes: 7 %
Neutrophils Absolute: 5.2 10*3/uL (ref 1.4–7.0)
Neutrophils: 52 %
Platelets: 290 10*3/uL (ref 150–450)
RBC: 4.77 x10E6/uL (ref 3.77–5.28)
RDW: 14.2 % (ref 11.7–15.4)
WBC: 9.7 10*3/uL (ref 3.4–10.8)

## 2024-04-14 LAB — COMPREHENSIVE METABOLIC PANEL WITH GFR
ALT: 32 IU/L (ref 0–32)
AST: 16 IU/L (ref 0–40)
Albumin: 4.3 g/dL (ref 3.9–4.9)
Alkaline Phosphatase: 107 IU/L (ref 44–121)
BUN/Creatinine Ratio: 12 (ref 9–23)
BUN: 10 mg/dL (ref 6–20)
Bilirubin Total: <0.2 mg/dL (ref 0.0–1.2)
CO2: 21 mmol/L (ref 20–29)
Calcium: 9.4 mg/dL (ref 8.7–10.2)
Chloride: 103 mmol/L (ref 96–106)
Creatinine, Ser: 0.83 mg/dL (ref 0.57–1.00)
Globulin, Total: 2.5 g/dL (ref 1.5–4.5)
Glucose: 78 mg/dL (ref 70–99)
Potassium: 3.7 mmol/L (ref 3.5–5.2)
Sodium: 138 mmol/L (ref 134–144)
Total Protein: 6.8 g/dL (ref 6.0–8.5)
eGFR: 93 mL/min/{1.73_m2} (ref >59)

## 2024-04-14 LAB — LIPID PANEL
Chol/HDL Ratio: 6.1 ratio — ABNORMAL HIGH (ref 0.0–4.4)
Cholesterol, Total: 224 mg/dL — ABNORMAL HIGH (ref 100–199)
HDL: 37 mg/dL — ABNORMAL LOW (ref >39)
LDL Chol Calc (NIH): 171 mg/dL — ABNORMAL HIGH (ref 0–99)
Triglycerides: 91 mg/dL (ref 0–149)
VLDL Cholesterol Cal: 16 mg/dL (ref 5–40)

## 2024-04-14 LAB — HEMOGLOBIN A1C
Est. average glucose Bld gHb Est-mCnc: 103 mg/dL
Hgb A1c MFr Bld: 5.2 % (ref 4.8–5.6)

## 2024-07-07 ENCOUNTER — Other Ambulatory Visit: Payer: Self-pay | Admitting: Family Medicine

## 2024-07-07 DIAGNOSIS — I10 Essential (primary) hypertension: Secondary | ICD-10-CM

## 2025-04-14 ENCOUNTER — Encounter: Admitting: Family Medicine
